# Patient Record
Sex: Female | Born: 1945
Health system: Southern US, Community
[De-identification: ages and names within clinical notes are randomized; demographics above are authoritative.]

## PROBLEM LIST (undated history)

## (undated) DIAGNOSIS — I10 Essential (primary) hypertension: Secondary | ICD-10-CM

## (undated) DIAGNOSIS — J309 Allergic rhinitis, unspecified: Secondary | ICD-10-CM

## (undated) DIAGNOSIS — I639 Cerebral infarction, unspecified: Secondary | ICD-10-CM

## (undated) DIAGNOSIS — M810 Age-related osteoporosis without current pathological fracture: Secondary | ICD-10-CM

## (undated) DIAGNOSIS — E871 Hypo-osmolality and hyponatremia: Secondary | ICD-10-CM

## (undated) DIAGNOSIS — G43109 Migraine with aura, not intractable, without status migrainosus: Secondary | ICD-10-CM

## (undated) DIAGNOSIS — E785 Hyperlipidemia, unspecified: Secondary | ICD-10-CM

## (undated) DIAGNOSIS — K219 Gastro-esophageal reflux disease without esophagitis: Secondary | ICD-10-CM

## (undated) DIAGNOSIS — R002 Palpitations: Secondary | ICD-10-CM

## (undated) HISTORY — DX: Palpitations: R00.2

## (undated) HISTORY — PX: ABDOMINAL HYSTERECTOMY: SHX81

## (undated) HISTORY — DX: Migraine with aura, not intractable, without status migrainosus: G43.109

## (undated) HISTORY — DX: Cerebral infarction, unspecified: I63.9

## (undated) HISTORY — DX: Hyperlipidemia, unspecified: E78.5

## (undated) HISTORY — DX: Allergic rhinitis, unspecified: J30.9

## (undated) HISTORY — DX: Hypo-osmolality and hyponatremia: E87.1

## (undated) HISTORY — DX: Age-related osteoporosis without current pathological fracture: M81.0

## (undated) HISTORY — PX: TONSILLECTOMY: SUR1361

## (undated) HISTORY — PX: CYSTOCELE REPAIR: SHX163

## (undated) HISTORY — DX: Gastro-esophageal reflux disease without esophagitis: K21.9

---

## 2014-06-28 DIAGNOSIS — I10 Essential (primary) hypertension: Secondary | ICD-10-CM | POA: Diagnosis not present

## 2014-06-28 DIAGNOSIS — E780 Pure hypercholesterolemia: Secondary | ICD-10-CM | POA: Diagnosis not present

## 2014-07-18 DIAGNOSIS — L814 Other melanin hyperpigmentation: Secondary | ICD-10-CM | POA: Diagnosis not present

## 2014-07-18 DIAGNOSIS — C44319 Basal cell carcinoma of skin of other parts of face: Secondary | ICD-10-CM | POA: Diagnosis not present

## 2014-07-18 DIAGNOSIS — D1801 Hemangioma of skin and subcutaneous tissue: Secondary | ICD-10-CM | POA: Diagnosis not present

## 2014-07-18 DIAGNOSIS — L821 Other seborrheic keratosis: Secondary | ICD-10-CM | POA: Diagnosis not present

## 2014-09-13 DIAGNOSIS — Z681 Body mass index (BMI) 19 or less, adult: Secondary | ICD-10-CM | POA: Diagnosis not present

## 2014-09-13 DIAGNOSIS — G459 Transient cerebral ischemic attack, unspecified: Secondary | ICD-10-CM | POA: Diagnosis not present

## 2014-09-13 DIAGNOSIS — M810 Age-related osteoporosis without current pathological fracture: Secondary | ICD-10-CM | POA: Diagnosis not present

## 2014-09-13 DIAGNOSIS — J309 Allergic rhinitis, unspecified: Secondary | ICD-10-CM | POA: Diagnosis not present

## 2014-09-13 DIAGNOSIS — I10 Essential (primary) hypertension: Secondary | ICD-10-CM | POA: Diagnosis not present

## 2014-09-13 DIAGNOSIS — R636 Underweight: Secondary | ICD-10-CM | POA: Diagnosis not present

## 2014-09-13 DIAGNOSIS — E780 Pure hypercholesterolemia: Secondary | ICD-10-CM | POA: Diagnosis not present

## 2014-09-13 DIAGNOSIS — H6690 Otitis media, unspecified, unspecified ear: Secondary | ICD-10-CM | POA: Diagnosis not present

## 2014-09-13 DIAGNOSIS — E46 Unspecified protein-calorie malnutrition: Secondary | ICD-10-CM | POA: Diagnosis not present

## 2014-09-21 DIAGNOSIS — R636 Underweight: Secondary | ICD-10-CM | POA: Diagnosis not present

## 2014-09-21 DIAGNOSIS — E780 Pure hypercholesterolemia: Secondary | ICD-10-CM | POA: Diagnosis not present

## 2014-09-21 DIAGNOSIS — I10 Essential (primary) hypertension: Secondary | ICD-10-CM | POA: Diagnosis not present

## 2014-09-21 DIAGNOSIS — H6123 Impacted cerumen, bilateral: Secondary | ICD-10-CM | POA: Diagnosis not present

## 2014-09-21 DIAGNOSIS — E46 Unspecified protein-calorie malnutrition: Secondary | ICD-10-CM | POA: Diagnosis not present

## 2014-09-21 DIAGNOSIS — J309 Allergic rhinitis, unspecified: Secondary | ICD-10-CM | POA: Diagnosis not present

## 2014-09-21 DIAGNOSIS — M810 Age-related osteoporosis without current pathological fracture: Secondary | ICD-10-CM | POA: Diagnosis not present

## 2014-09-21 DIAGNOSIS — G459 Transient cerebral ischemic attack, unspecified: Secondary | ICD-10-CM | POA: Diagnosis not present

## 2014-10-01 DIAGNOSIS — R636 Underweight: Secondary | ICD-10-CM | POA: Diagnosis not present

## 2014-10-01 DIAGNOSIS — M810 Age-related osteoporosis without current pathological fracture: Secondary | ICD-10-CM | POA: Diagnosis not present

## 2014-10-01 DIAGNOSIS — G459 Transient cerebral ischemic attack, unspecified: Secondary | ICD-10-CM | POA: Diagnosis not present

## 2014-10-01 DIAGNOSIS — I10 Essential (primary) hypertension: Secondary | ICD-10-CM | POA: Diagnosis not present

## 2014-10-01 DIAGNOSIS — N959 Unspecified menopausal and perimenopausal disorder: Secondary | ICD-10-CM | POA: Diagnosis not present

## 2014-10-01 DIAGNOSIS — E46 Unspecified protein-calorie malnutrition: Secondary | ICD-10-CM | POA: Diagnosis not present

## 2014-10-01 DIAGNOSIS — E780 Pure hypercholesterolemia: Secondary | ICD-10-CM | POA: Diagnosis not present

## 2014-10-01 DIAGNOSIS — J309 Allergic rhinitis, unspecified: Secondary | ICD-10-CM | POA: Diagnosis not present

## 2014-10-03 DIAGNOSIS — H6502 Acute serous otitis media, left ear: Secondary | ICD-10-CM | POA: Diagnosis not present

## 2014-10-03 DIAGNOSIS — H6982 Other specified disorders of Eustachian tube, left ear: Secondary | ICD-10-CM | POA: Diagnosis not present

## 2014-10-03 DIAGNOSIS — J342 Deviated nasal septum: Secondary | ICD-10-CM | POA: Diagnosis not present

## 2014-10-03 DIAGNOSIS — H9193 Unspecified hearing loss, bilateral: Secondary | ICD-10-CM | POA: Diagnosis not present

## 2014-10-17 DIAGNOSIS — H6982 Other specified disorders of Eustachian tube, left ear: Secondary | ICD-10-CM | POA: Diagnosis not present

## 2014-10-17 DIAGNOSIS — H6502 Acute serous otitis media, left ear: Secondary | ICD-10-CM | POA: Diagnosis not present

## 2014-10-18 DIAGNOSIS — Z1231 Encounter for screening mammogram for malignant neoplasm of breast: Secondary | ICD-10-CM | POA: Diagnosis not present

## 2014-10-20 DIAGNOSIS — N3020 Other chronic cystitis without hematuria: Secondary | ICD-10-CM | POA: Diagnosis not present

## 2014-10-20 DIAGNOSIS — R3915 Urgency of urination: Secondary | ICD-10-CM | POA: Diagnosis not present

## 2014-10-20 DIAGNOSIS — N815 Vaginal enterocele: Secondary | ICD-10-CM | POA: Diagnosis not present

## 2014-11-24 DIAGNOSIS — N3020 Other chronic cystitis without hematuria: Secondary | ICD-10-CM | POA: Diagnosis not present

## 2014-12-05 DIAGNOSIS — H6982 Other specified disorders of Eustachian tube, left ear: Secondary | ICD-10-CM | POA: Diagnosis not present

## 2014-12-05 DIAGNOSIS — H6505 Acute serous otitis media, recurrent, left ear: Secondary | ICD-10-CM | POA: Diagnosis not present

## 2014-12-08 DIAGNOSIS — D485 Neoplasm of uncertain behavior of skin: Secondary | ICD-10-CM | POA: Diagnosis not present

## 2014-12-08 DIAGNOSIS — N3020 Other chronic cystitis without hematuria: Secondary | ICD-10-CM | POA: Diagnosis not present

## 2014-12-08 DIAGNOSIS — N815 Vaginal enterocele: Secondary | ICD-10-CM | POA: Diagnosis not present

## 2014-12-19 DIAGNOSIS — H6505 Acute serous otitis media, recurrent, left ear: Secondary | ICD-10-CM | POA: Diagnosis not present

## 2014-12-19 DIAGNOSIS — H6982 Other specified disorders of Eustachian tube, left ear: Secondary | ICD-10-CM | POA: Diagnosis not present

## 2014-12-31 DIAGNOSIS — J309 Allergic rhinitis, unspecified: Secondary | ICD-10-CM | POA: Diagnosis not present

## 2014-12-31 DIAGNOSIS — R636 Underweight: Secondary | ICD-10-CM | POA: Diagnosis not present

## 2014-12-31 DIAGNOSIS — M810 Age-related osteoporosis without current pathological fracture: Secondary | ICD-10-CM | POA: Diagnosis not present

## 2014-12-31 DIAGNOSIS — N959 Unspecified menopausal and perimenopausal disorder: Secondary | ICD-10-CM | POA: Diagnosis not present

## 2014-12-31 DIAGNOSIS — E46 Unspecified protein-calorie malnutrition: Secondary | ICD-10-CM | POA: Diagnosis not present

## 2014-12-31 DIAGNOSIS — E7800 Pure hypercholesterolemia, unspecified: Secondary | ICD-10-CM | POA: Diagnosis not present

## 2014-12-31 DIAGNOSIS — G459 Transient cerebral ischemic attack, unspecified: Secondary | ICD-10-CM | POA: Diagnosis not present

## 2014-12-31 DIAGNOSIS — I10 Essential (primary) hypertension: Secondary | ICD-10-CM | POA: Diagnosis not present

## 2016-08-29 ENCOUNTER — Ambulatory Visit: Payer: Medicare Other | Admitting: Sports Medicine

## 2016-08-30 ENCOUNTER — Encounter: Payer: Self-pay | Admitting: Sports Medicine

## 2016-08-30 ENCOUNTER — Ambulatory Visit (INDEPENDENT_AMBULATORY_CARE_PROVIDER_SITE_OTHER): Payer: Medicare Other | Admitting: Sports Medicine

## 2016-08-30 VITALS — BP 114/64 | HR 68 | Ht 66.0 in | Wt 124.0 lb

## 2016-08-30 DIAGNOSIS — M79675 Pain in left toe(s): Secondary | ICD-10-CM | POA: Diagnosis not present

## 2016-08-30 DIAGNOSIS — L600 Ingrowing nail: Secondary | ICD-10-CM

## 2016-08-30 DIAGNOSIS — L601 Onycholysis: Secondary | ICD-10-CM | POA: Diagnosis not present

## 2016-08-30 NOTE — Progress Notes (Signed)
  Subjective:  Patient ID: Olivia Blackburn, female    DOB: 12/20/1945,  MRN: [id number] HPI Chief Complaint  Patient presents with  . Nail Problem    left great toe nail has had multiple injuries over the years is tender hand lifing from the nail bed  last week it was very sore    71 y.o. female presents with the above complaint. Complains of multiple traumas to her nail and states the nail is very loose. Reports the nail is painful and gets caught on her clothing and stockings. Denies drainage from the nail. Reports the toe is slightly reddened from recent spider bites.  No past medical history on file. No past surgical history on file.  Current Outpatient Prescriptions:  .  aspirin EC 81 MG tablet, Take 81 mg by mouth., Disp: , Rfl:  .  Calcium-Vitamin D-Vitamin K (CALCIUM SOFT CHEWS) 500-500-40 MG-UNT-MCG CHEW, Chew by mouth., Disp: , Rfl:  .  denosumab (PROLIA) 60 MG/ML SOLN injection, Inject 60 mg into the skin every 6 (six) months. Administer in upper arm, thigh, or abdomen, Disp: , Rfl:  .  lisinopril-hydrochlorothiazide (PRINZIDE,ZESTORETIC) 10-12.5 MG tablet, Take by mouth., Disp: , Rfl:  .  loratadine (CLARITIN) 10 MG tablet, Take 10 mg by mouth., Disp: , Rfl:  .  trimethoprim (TRIMPEX) 100 MG tablet, Take 100 mg by mouth 2 (two) times daily., Disp: , Rfl:   Allergies  Allergen Reactions  . Azithromycin Rash   Review of Systems  See Nursing note. Objective:   Vitals:   08/30/16 0928  BP: 114/64  Pulse: 68   General AA&O x3. Normal mood and affect.  Vascular Dorsalis pedis and posterior tibial pulses 2/4 bilat. Brisk capillary refill to all digits. Pedal hair present. Small verocosities bilat.  Neurologic Epicritic sensation grossly intact.  Dermatologic L hallux nail >50% lysis distally. Adhered proximally. Small area of dried hemorrhage underneath the nail.  Orthopedic: MMT 5/5 in dorsiflexion, plantarflexion, inversion, and eversion. Normal joint ROM without pain  or crepitus.   Radiographs: Taken and reviewed. No acute fractures or dislocations. No other osseous abnormalities.  Assessment & Plan:  Patient was evaluated and treated and all questions answered.  Onycholysis of Nail, Pain in Toe L Great Toe -Discussed r/b/a of nail removal. Patient elected to proceed. -Nail anesthestized with 3cc 50/50 1% Lidocaine plain / 0.5% Marcaine plain. -Nail freed with a freer and avulsed with a hemostat. Redundant subungual tissue excised with a tissue nipper. Dressed with silvadene and dry dressing. -Educated on soaking instructions. Written instructions dispensed.  Return in about 2 weeks (around 09/16/2016).   Patient seen with Dr. Shawnie DapperPrice  Vi Whitesel

## 2016-08-30 NOTE — Progress Notes (Signed)
   Subjective:    Patient ID: Olivia Blackburn, female    DOB: 08/01/1945, 71 y.o.   MRN: [id number]  HPI  I have injured my left great toe on numerous occasions last week it got very sore and is now lifting away from the nailbed.    Review of Systems  All other systems reviewed and are negative.      Objective:   Physical Exam        Assessment & Plan:

## 2016-08-30 NOTE — Patient Instructions (Signed)

## 2016-09-16 ENCOUNTER — Ambulatory Visit (INDEPENDENT_AMBULATORY_CARE_PROVIDER_SITE_OTHER): Payer: Self-pay | Admitting: Podiatry

## 2016-09-16 DIAGNOSIS — L600 Ingrowing nail: Secondary | ICD-10-CM

## 2016-09-16 DIAGNOSIS — Z9889 Other specified postprocedural states: Secondary | ICD-10-CM

## 2016-09-16 NOTE — Progress Notes (Signed)
   Subjective:    Patient ID: Olivia Blackburn, female    DOB: Apr 14, 1945, 71 y.o.   MRN: [id number]  HPI 71 y.o. female presents for follow up of L great toenail avulsion performed two weeks ago. Reports the toe was sore for the first week. Reports soaking once daily. States the procedure site feels "good but a little sore."  Review of Systems - not performed.     Objective:   Physical Exam  There were no vitals filed for this visit. General AA&O x3. Normal mood and affect.  Vascular Foot warm and well perfused with good capillary refill.  Neurologic Sensation grossly intact.  Dermatologic Nail avulsion site healing well without drainage or erythema. Nail bed with overlying soft crust. Left intact. No signs of local infection.  Orthopedic: No tenderness to palpation of the toe.     Assessment & Plan:   2 weeks s/p L Total Nail Avulsion with Matrixectomy -Healing well. No signs of infection. -Advised to leave OTA. No need for continued soaking.  Return if symptoms worsen or fail to improve.

## 2017-05-13 ENCOUNTER — Emergency Department (HOSPITAL_COMMUNITY): Payer: Medicare Other

## 2017-05-13 ENCOUNTER — Emergency Department (HOSPITAL_COMMUNITY)
Admission: EM | Admit: 2017-05-13 | Discharge: 2017-05-13 | Disposition: A | Discharge status: Home / Self Care | Payer: Medicare Other | Attending: Emergency Medicine | Admitting: Emergency Medicine

## 2017-05-13 DIAGNOSIS — Y998 Other external cause status: Secondary | ICD-10-CM | POA: Diagnosis not present

## 2017-05-13 DIAGNOSIS — Z7982 Long term (current) use of aspirin: Secondary | ICD-10-CM | POA: Insufficient documentation

## 2017-05-13 DIAGNOSIS — S92354A Nondisplaced fracture of fifth metatarsal bone, right foot, initial encounter for closed fracture: Secondary | ICD-10-CM | POA: Insufficient documentation

## 2017-05-13 DIAGNOSIS — W010XXA Fall on same level from slipping, tripping and stumbling without subsequent striking against object, initial encounter: Secondary | ICD-10-CM | POA: Insufficient documentation

## 2017-05-13 DIAGNOSIS — S82831A Other fracture of upper and lower end of right fibula, initial encounter for closed fracture: Secondary | ICD-10-CM | POA: Diagnosis not present

## 2017-05-13 DIAGNOSIS — Z79899 Other long term (current) drug therapy: Secondary | ICD-10-CM | POA: Insufficient documentation

## 2017-05-13 DIAGNOSIS — Y92009 Unspecified place in unspecified non-institutional (private) residence as the place of occurrence of the external cause: Secondary | ICD-10-CM | POA: Insufficient documentation

## 2017-05-13 DIAGNOSIS — S32010A Wedge compression fracture of first lumbar vertebra, initial encounter for closed fracture: Secondary | ICD-10-CM | POA: Insufficient documentation

## 2017-05-13 DIAGNOSIS — Y9389 Activity, other specified: Secondary | ICD-10-CM | POA: Insufficient documentation

## 2017-05-13 DIAGNOSIS — S3992XA Unspecified injury of lower back, initial encounter: Secondary | ICD-10-CM | POA: Diagnosis present

## 2017-05-13 DIAGNOSIS — W19XXXA Unspecified fall, initial encounter: Secondary | ICD-10-CM

## 2017-05-13 LAB — CBC WITH DIFFERENTIAL/PLATELET
BASOS ABS: 0 10*3/uL (ref 0.0–0.1)
BASOS PCT: 0 %
EOS PCT: 2 %
Eosinophils Absolute: 0.2 10*3/uL (ref 0.0–0.7)
HEMATOCRIT: 38.6 % (ref 36.0–46.0)
Hemoglobin: 13 g/dL (ref 12.0–15.0)
LYMPHS PCT: 12 %
Lymphs Abs: 0.9 10*3/uL (ref 0.7–4.0)
MCH: 31.2 pg (ref 26.0–34.0)
MCHC: 33.7 g/dL (ref 30.0–36.0)
MCV: 92.6 fL (ref 78.0–100.0)
Monocytes Absolute: 0.4 10*3/uL (ref 0.1–1.0)
Monocytes Relative: 6 %
NEUTROS ABS: 6.5 10*3/uL (ref 1.7–7.7)
Neutrophils Relative %: 80 %
PLATELETS: 270 10*3/uL (ref 150–400)
RBC: 4.17 MIL/uL (ref 3.87–5.11)
RDW: 13 % (ref 11.5–15.5)
WBC: 8 10*3/uL (ref 4.0–10.5)

## 2017-05-13 LAB — BASIC METABOLIC PANEL
ANION GAP: 10 (ref 5–15)
BUN: 28 mg/dL — ABNORMAL HIGH (ref 6–20)
CO2: 28 mmol/L (ref 22–32)
Calcium: 9.6 mg/dL (ref 8.9–10.3)
Chloride: 101 mmol/L (ref 101–111)
Creatinine, Ser: 1.04 mg/dL — ABNORMAL HIGH (ref 0.44–1.00)
GFR calc Af Amer: >60 mL/min (ref >60)
GFR calc non Af Amer: 52 mL/min — ABNORMAL LOW (ref >60)
Glucose, Bld: 104 mg/dL — ABNORMAL HIGH (ref 65–99)
POTASSIUM: 4.1 mmol/L (ref 3.5–5.1)
SODIUM: 139 mmol/L (ref 135–145)

## 2017-05-13 MED ORDER — HYDROCODONE-ACETAMINOPHEN 5-325 MG PO TABS: 1.0000 | ORAL_TABLET | Freq: Four times a day (QID) | ORAL | 0 refills | Status: DC | PRN

## 2017-05-13 MED ORDER — HYDROCODONE-ACETAMINOPHEN 5-325 MG PO TABS
1.0000 | ORAL_TABLET | Freq: Once | ORAL | Status: AC
  Administered 2017-05-13: 1 via ORAL
  Filled 2017-05-13: qty 1

## 2017-05-13 MED ORDER — DOCUSATE SODIUM 100 MG PO CAPS: 100.0000 mg | ORAL_CAPSULE | Freq: Two times a day (BID) | ORAL | 0 refills | Status: DC

## 2017-05-13 NOTE — Discharge Instructions (Signed)
Get help right away if: Your pain is very bad and it suddenly gets worse. You are unable to move any body part (paralysis) that is below the level of your injury. You have numbness, tingling, or weakness in any body part that is below the level of your injury. You cannot control your bladder or bowels.

## 2017-05-13 NOTE — ED Notes (Signed)
Patient transported to CT 

## 2017-05-13 NOTE — ED Notes (Signed)
Pt ambulate from the room to the restroom with 2 person assist and back from the restroom to the room with no assist

## 2017-05-13 NOTE — ED Provider Notes (Signed)
[facility]-EMERGENCY DEPT Provider Note   CSN: [id number] Arrival date & time: 05/13/17  1452     History   Chief Complaint Chief Complaint  Patient presents with  . Fall    HPI Olivia Blackburn is a 72 y.o. female  Who fell in her home on the way to answer her door. She tripped over her R foot and fell onto her R side. She c/o pain over the R ankle and lumbar region. She did not hit her head or LOC. She denies numbness, tnigling or weakness. She does have swelling in the foot. She was unable to ambulate and had to call 911. She is not on anticoagulation.  HPI  No past medical history on file.  There are no active problems to display for this patient.   The histories are not reviewed yet. Please review them in the "History" navigator section and refresh this SmartLink.   OB History   None      Home Medications    Prior to Admission medications   Medication Sig Start Date End Date Taking? Authorizing Provider  aspirin EC 81 MG tablet Take 81 mg by mouth daily.  08/06/11  Yes [provider]  Calcium-Vitamin D-Vitamin K (CALCIUM SOFT CHEWS) 500-500-40 MG-UNT-MCG CHEW Chew 1 tablet by mouth daily.  08/06/11  Yes [provider]  cetirizine (ZYRTEC ALLERGY) 10 MG tablet Take 10 mg by mouth daily as needed for allergies.   Yes [provider]  conjugated estrogens (PREMARIN) vaginal cream Place 1 Applicatorful vaginally every other day.   Yes [provider]  lisinopril (PRINIVIL,ZESTRIL) 20 MG tablet  02/19/17  Yes [provider]  loratadine (CLARITIN) 10 MG tablet Take 10 mg by mouth daily as needed for allergies.  08/06/11  Yes [provider]  traMADol (ULTRAM) 50 MG tablet TAKE 1 TABLET EVERY 6 HOURS AS NEEDED FOR MODERATE PAIN. 04/29/17  Yes [provider]  trimethoprim (TRIMPEX) 100 MG tablet Take 100 mg by mouth at bedtime.    Yes [provider]  denosumab (PROLIA) 60 MG/ML SOLN  injection Inject 60 mg into the skin every 6 (six) months. Administer in upper arm, thigh, or abdomen    [provider]  docusate sodium (COLACE) 100 MG capsule Take 1 capsule (100 mg total) by mouth every 12 (twelve) hours. 05/13/17   Arthor CaptainHarris, Nonna Renninger, PA-C  HYDROcodone-acetaminophen (NORCO) 5-325 MG tablet Take 1-2 tablets by mouth every 6 (six) hours as needed for moderate pain. 05/13/17   Arthor CaptainHarris, Eschol Auxier, PA-C    Family History No family history on file.  Social History Social History   Tobacco Use  . Smoking status: Never Smoker  . Smokeless tobacco: Never Used  Substance Use Topics  . Alcohol use: Not on file  . Drug use: Not on file     Allergies   Azithromycin   Review of Systems Review of Systems  Ten systems reviewed and are negative for acute change, except as noted in the HPI.   Physical Exam Updated Vital Signs BP 112/62   Pulse 74   Temp 97.8 F (36.6 C) (Oral)   Resp 16   Ht 5\' 6"  (1.676 m)   Wt 57.2 kg (126 lb)   SpO2 98%   BMI 20.34 kg/m   Physical Exam  Constitutional: She is oriented to person, place, and time. She appears well-developed and well-nourished. No distress.  HENT:  Head: Normocephalic and atraumatic.  Eyes: Conjunctivae are normal. No scleral  icterus.  Neck: Normal range of motion.  Cardiovascular: Normal rate, regular rhythm and normal heart sounds. Exam reveals no gallop and no friction rub.  No murmur heard. Pulmonary/Chest: Effort normal and breath sounds normal. No respiratory distress.  Abdominal: Soft. Bowel sounds are normal. She exhibits no distension and no mass. There is no tenderness. There is no guarding.  Musculoskeletal: She exhibits tenderness.  Midline lumbar tenderness  Right foot with market edema over the proximal lateral metatarsal region and market edema over the right lateral malleolus,  Full passive range of motion of the ankle, tender to palpation with movement, normal sensation and pulses    Neurological: She is alert and oriented to person, place, and time.  Skin: Skin is warm and dry. She is not diaphoretic.  Psychiatric: Her behavior is normal.  Nursing note and vitals reviewed.    ED Treatments / Results  Labs (all labs ordered are listed, but only abnormal results are displayed) Labs Reviewed  BASIC METABOLIC PANEL - Abnormal; Notable for the following components:      Result Value   Glucose, Bld 104 (*)    BUN 28 (*)    Creatinine, Ser 1.04 (*)    GFR calc non Af Amer 52 (*)    All other components within normal limits  CBC WITH DIFFERENTIAL/PLATELET    EKG None  Radiology Dg Lumbar Spine Complete  Result Date: 05/13/2017 CLINICAL DATA:  Fall EXAM: LUMBAR SPINE - COMPLETE 4+ VIEW COMPARISON:  None. FINDINGS: Five non rib bearing lumbar type vertebra. Lumbar alignment within normal limits. Moderate degenerative changes at L5-S1. Mild degenerative spurring of the lumbar spine. Probable acute moderate superior endplate deformity at L1. Less than 25% loss of height of the anterior vertebral body. IMPRESSION: 1. Probable acute moderate superior endplate compression fracture at L1 2. Degenerative changes of the lumbar spine. Electronically Signed   By: Jasmine Pang M.D.   On: 05/13/2017 16:52   Dg Sacrum/coccyx  Result Date: 05/13/2017 CLINICAL DATA:  Fall EXAM: SACRUM AND COCCYX - 2+ VIEW COMPARISON:  None. FINDINGS: Old appearing fracture deformity of the right inferior pubic ramus. Pubic symphysis is intact. No acute displaced sacral fracture. Calcified phleboliths in the left pelvis. IMPRESSION: 1. No definite acute osseous abnormality 2. Old appearing fracture right inferior pubic ramus Electronically Signed   By: Jasmine Pang M.D.   On: 05/13/2017 16:50   Dg Ankle Complete Right  Result Date: 05/13/2017 CLINICAL DATA:  Fall with ankle pain EXAM: RIGHT ANKLE - COMPLETE 3+ VIEW COMPARISON:  None. FINDINGS: Lateral soft tissue swelling. Acute avulsion fracture  injury tip of the lateral malleolus of the fibula. Ankle mortise symmetric. Nondisplaced fracture base of fifth metatarsal. Small plantar calcaneal spur IMPRESSION: 1. Acute avulsion fracture injury at the tip of the lateral fibular malleolus with soft tissue swelling 2. Nondisplaced fracture base of the fifth metatarsal Electronically Signed   By: Jasmine Pang M.D.   On: 05/13/2017 16:47   Ct Lumbar Spine Wo Contrast  Result Date: 05/13/2017 CLINICAL DATA:  72 y/o  F; fall with back pain. EXAM: CT LUMBAR SPINE WITHOUT CONTRAST TECHNIQUE: Multidetector CT imaging of the lumbar spine was performed without intravenous contrast administration. Multiplanar CT image reconstructions were also generated. COMPARISON:  05/13/2017 lumbar spine radiographs. FINDINGS: Segmentation: 5 lumbar type vertebrae. Alignment: Mild S-shaped curvature of lumbar spine. Normal lumbar lordosis without listhesis. Vertebrae: L1 superior endplate acute anterior compression deformity with 20% loss of vertebral body height. No other fracture identified. Paraspinal  and other soft tissues: Right lobe of liver cyst measuring 25 mm. Disc levels: Mild loss of intervertebral disc space height at the L3-4 and L4-5 levels with moderate loss of intervertebral disc space height at L5-S1. Disc bulges at the L3 through S1 levels result in multilevel mild foraminal stenosis. At the right L5-S1 level, facet hypertrophy and endplate marginal osteophytes contribute to moderate right-sided foraminal stenosis. IMPRESSION: 1. Acute L1 superior endplate anterior compression deformity with 20% loss of vertebral body height. No other fracture identified. 2. Lumbar spondylosis greatest at L5-S1. No high-grade foraminal or canal stenosis identified. Electronically Signed   By: Mitzi Hansen M.D.   On: 05/13/2017 17:57   Dg Foot Complete Right  Result Date: 05/13/2017 CLINICAL DATA:  Fall EXAM: RIGHT FOOT COMPLETE - 3+ VIEW COMPARISON:  None.  FINDINGS: Acute minimally displaced fracture base of fifth metatarsal with extension to the fifth TMT joint. No subluxation. Mild degenerative changes at the first MTP joint. Small plantar calcaneal spur IMPRESSION: Acute minimally displaced intra-articular fracture base of fifth metatarsal Electronically Signed   By: Jasmine Pang M.D.   On: 05/13/2017 16:49    Procedures Procedures (including critical care time)  Medications Ordered in ED Medications  HYDROcodone-acetaminophen (NORCO/VICODIN) 5-325 MG per tablet 1 tablet (1 tablet Oral Given 05/13/17 1702)     Initial Impression / Assessment and Plan / ED Course  I have reviewed the triage vital signs and the nursing notes.  Pertinent labs & imaging results that were available during my care of the patient were reviewed by me and considered in my medical decision making (see chart for details).     Patient with L1 vertebral compression fracture visualized on plain film and CT scan.  Only 20% height loss on CT.  Patient placed in TLSO splint.  She has a pseudo-Jones fracture of the right foot and a small avulsion fracture of the distal tibia.  These are stable fractures and are safe for weightbearing.  I have reviewed these images with Dr. Shaune Pollack who agrees with plan of care.  Patient placed in cam walker boot labs are otherwise without significant abnormality.  Patient is able to ambulate safely here in the emergency department.  Patient be discharged with Norco for pain control, appropriate outpatient follow-up and return precautions.  Final Clinical Impressions(s) / ED Diagnoses   Final diagnoses:  Fall, initial encounter  Closed wedge compression fracture of first lumbar vertebra, initial encounter (HCC)  Other closed fracture of distal end of right fibula, initial encounter  Closed nondisplaced fracture of fifth metatarsal bone of right foot, initial encounter    ED Discharge Orders        Ordered     HYDROcodone-acetaminophen (NORCO) 5-325 MG tablet  Every 6 hours PRN     05/13/17 2220    docusate sodium (COLACE) 100 MG capsule  Every 12 hours     05/13/17 2220       Arthor Captain, PA-C 05/13/17 2230    Nira Conn, MD 05/14/17 [phone]

## 2017-05-13 NOTE — ED Notes (Signed)
Patient transported to X-ray 

## 2017-05-13 NOTE — ED Triage Notes (Signed)
Transported by [facility] from home--experienced a fall today injuring right foot and lower back. Swelling and obvious deformity to right foot per EMS. Denies hitting her head or any LOC. EMS administered 600 mg of Ibuprofen. VSS with EMS.

## 2017-05-13 NOTE — ED Notes (Signed)
Bed: [id number] Expected date:  Expected time:  Means of arrival:  Comments: EMS-fall-foot deformity

## 2017-05-16 ENCOUNTER — Other Ambulatory Visit: Payer: Self-pay

## 2017-05-16 ENCOUNTER — Emergency Department (HOSPITAL_COMMUNITY)
Admission: EM | Admit: 2017-05-16 | Discharge: 2017-05-16 | Disposition: A | Discharge status: Home / Self Care | Payer: Medicare Other | Attending: Emergency Medicine | Admitting: Emergency Medicine

## 2017-05-16 ENCOUNTER — Encounter (HOSPITAL_COMMUNITY): Payer: Self-pay

## 2017-05-16 DIAGNOSIS — I10 Essential (primary) hypertension: Secondary | ICD-10-CM | POA: Insufficient documentation

## 2017-05-16 DIAGNOSIS — G8911 Acute pain due to trauma: Secondary | ICD-10-CM | POA: Diagnosis not present

## 2017-05-16 DIAGNOSIS — Z79899 Other long term (current) drug therapy: Secondary | ICD-10-CM | POA: Diagnosis not present

## 2017-05-16 DIAGNOSIS — S93409A Sprain of unspecified ligament of unspecified ankle, initial encounter: Secondary | ICD-10-CM | POA: Insufficient documentation

## 2017-05-16 DIAGNOSIS — S32010D Wedge compression fracture of first lumbar vertebra, subsequent encounter for fracture with routine healing: Secondary | ICD-10-CM | POA: Insufficient documentation

## 2017-05-16 DIAGNOSIS — Z7982 Long term (current) use of aspirin: Secondary | ICD-10-CM | POA: Diagnosis not present

## 2017-05-16 DIAGNOSIS — S92353A Displaced fracture of fifth metatarsal bone, unspecified foot, initial encounter for closed fracture: Secondary | ICD-10-CM

## 2017-05-16 DIAGNOSIS — W1830XD Fall on same level, unspecified, subsequent encounter: Secondary | ICD-10-CM | POA: Insufficient documentation

## 2017-05-16 HISTORY — DX: Displaced fracture of fifth metatarsal bone, unspecified foot, initial encounter for closed fracture: S92.353A

## 2017-05-16 HISTORY — DX: Essential (primary) hypertension: I10

## 2017-05-16 HISTORY — DX: Sprain of unspecified ligament of unspecified ankle, initial encounter: S93.409A

## 2017-05-16 MED ORDER — OXYCODONE HCL 5 MG PO TABS
10.0000 mg | ORAL_TABLET | ORAL | Status: AC
  Administered 2017-05-16: 10 mg via ORAL
  Filled 2017-05-16: qty 2

## 2017-05-16 MED ORDER — METHOCARBAMOL 500 MG PO TABS
500.0000 mg | ORAL_TABLET | Freq: Once | ORAL | Status: AC
  Administered 2017-05-16: 500 mg via ORAL
  Filled 2017-05-16: qty 1

## 2017-05-16 MED ORDER — OXYCODONE HCL 5 MG PO TABS: 5.0000 mg | ORAL_TABLET | ORAL | 0 refills | Status: DC | PRN

## 2017-05-16 MED ORDER — METHOCARBAMOL 500 MG PO TABS: 500.0000 mg | ORAL_TABLET | Freq: Four times a day (QID) | ORAL | 0 refills | Status: DC | PRN

## 2017-05-16 NOTE — ED Triage Notes (Signed)
Per [facility]: Pt coming from home complaining of lower back pain. Pt fell two days ago resulting in a fracture to right foot and L1 compression. Currently wearing brace. Hx of htn but reports she did not take today. Reports she took Oxycodone at 2200 without relief.

## 2017-05-16 NOTE — ED Provider Notes (Signed)
[facility]   CSN: [id number] Arrival date & time: 05/16/17  0200     History   Chief Complaint Chief Complaint  Patient presents with  . Back Pain    HPI MODELL FENDRICK is a 72 y.o. female.  The history is provided by the patient.  She has history of hypertension and had been seen in the emergency department 3 days ago with a fall with fractures of her right foot and compression fracture of L1.  She has a TLSO brace and had been given prescription for hydrocodone-acetaminophen.  She had been doing well at home, and took a dose of hydrocodone's-acetaminophen at bedtime, but has been in severe pain and has been unable to move because of the pain.  She denies any weakness or numbness or tingling.  She is concerned because she lives alone.  She also notices muscle spasm-like something grabbing her.  She has an appointment with a neurosurgeon on May 2, but does not feel she can wait that long.  Past Medical History:  Diagnosis Date  . Hypertension     There are no active problems to display for this patient.   Past Surgical History:  Procedure Laterality Date  . ABDOMINAL HYSTERECTOMY       OB History   None      Home Medications    Prior to Admission medications   Medication Sig Start Date End Date Taking? Authorizing Provider  aspirin EC 81 MG tablet Take 81 mg by mouth daily.  08/06/11   [provider]  Calcium-Vitamin D-Vitamin K (CALCIUM SOFT CHEWS) 500-500-40 MG-UNT-MCG CHEW Chew 1 tablet by mouth daily.  08/06/11   [provider]  cetirizine (ZYRTEC ALLERGY) 10 MG tablet Take 10 mg by mouth daily as needed for allergies.    [provider]  conjugated estrogens (PREMARIN) vaginal cream Place 1 Applicatorful vaginally every other day.    [provider]  denosumab (PROLIA) 60 MG/ML SOLN injection Inject 60 mg into the skin every 6 (six) months. Administer in upper arm, thigh, or  abdomen    [provider]  docusate sodium (COLACE) 100 MG capsule Take 1 capsule (100 mg total) by mouth every 12 (twelve) hours. 05/13/17   Arthor Captain, PA-C  HYDROcodone-acetaminophen (NORCO) 5-325 MG tablet Take 1-2 tablets by mouth every 6 (six) hours as needed for moderate pain. 05/13/17   Arthor Captain, PA-C  lisinopril (PRINIVIL,ZESTRIL) 20 MG tablet  02/19/17   [provider]  loratadine (CLARITIN) 10 MG tablet Take 10 mg by mouth daily as needed for allergies.  08/06/11   [provider]  traMADol (ULTRAM) 50 MG tablet TAKE 1 TABLET EVERY 6 HOURS AS NEEDED FOR MODERATE PAIN. 04/29/17   [provider]  trimethoprim (TRIMPEX) 100 MG tablet Take 100 mg by mouth at bedtime.     [provider]    Family History No family history on file.  Social History Social History   Tobacco Use  . Smoking status: Never Smoker  . Smokeless tobacco: Never Used  Substance Use Topics  . Alcohol use: Not on file  . Drug use: Not on file     Allergies   Azithromycin   Review of Systems Review of Systems  All other systems reviewed and are negative.    Physical Exam Updated Vital Signs BP 133/77 (BP Location: Left Arm)   Pulse 95   Temp 98.5 F (36.9 C) (Oral)   Resp 18  Ht 5\' 6"  (1.676 m)   Wt 57.2 kg (126 lb)   SpO2 95%   BMI 20.34 kg/m   Physical Exam  Nursing note and vitals reviewed.  72 year old female, resting comfortably and in no acute distress. Vital signs are normal. Oxygen saturation is 95%, which is normal. Head is normocephalic and atraumatic. PERRLA, EOMI. Oropharynx is clear. Neck is nontender and supple without adenopathy or JVD. Back: TLSO brace is in place and not removed. Lungs are clear without rales, wheezes, or rhonchi. Chest is nontender. Heart has regular rate and rhythm without murmur. Abdomen is soft, flat, nontender without masses or hepatosplenomegaly and peristalsis is normoactive. Extremities  have no cyanosis or edema, full range of motion is present. Skin is warm and dry without rash. Neurologic: Mental status is normal, cranial nerves are intact, there are no motor or sensory deficits.  ED Treatments / Results   Procedures Procedures   Medications Ordered in ED Medications  oxyCODONE (Oxy IR/ROXICODONE) immediate release tablet 10 mg (has no administration in time range)  methocarbamol (ROBAXIN) tablet 500 mg (has no administration in time range)     Initial Impression / Assessment and Plan / ED Course  I have reviewed the triage vital signs and the nursing notes.  Pertinent labs & imaging results that were available during my care of the patient were reviewed by me and considered in my medical decision making (see chart for details).  Compression fracture of T1 with poor pain control.  Old records are reviewed, and CT scan done at that visit on April 23 showed compression fracture of L1 with 20% loss of height.  Since she has failed hydrocodone, will try oxycodone.  She also has significant muscle spasm, so we will give a dose of methocarbamol.  No indication for repeat imaging.   5:28 AM She had good relief of pain with above-noted treatment.  She is discharged with prescriptions for methocarbamol and oxycodone.  Advised to supplement with acetaminophen and/or ibuprofen to get additional pain relief.  Advised to apply ice.  Since symptoms seem to be worse with wearing a brace, advised to try going without the brace to see if she is feeling any better.  She is to call her neurosurgeon's office to see if she can get an appointment sooner than the scheduled one.  Return precautions discussed.  Final Clinical Impressions(s) / ED Diagnoses   Final diagnoses:  Closed compression fracture of L1 lumbar vertebra with routine healing, subsequent encounter    ED Discharge Orders        Ordered    methocarbamol (ROBAXIN) 500 MG tablet  Every 6 hours PRN     05/16/17 0525     oxyCODONE (ROXICODONE) 5 MG immediate release tablet  Every 4 hours PRN     05/16/17 0525       Dione BoozeGlick, Thekla Colborn, MD 05/16/17 [phone]

## 2017-05-16 NOTE — ED Notes (Signed)
ED Provider at bedside. 

## 2017-05-16 NOTE — Discharge Instructions (Addendum)
Apply ice as needed. Wear your brace as needed.  In addition to the prescribed medication, you can take acetaminophen (Tylenol) and/or ibuprofen to get additional pain relief.  Talk with your neurosurgeon to see if kyphoplasty (injection of cement into the collapsed vertebra) would be helpful.

## 2017-07-02 ENCOUNTER — Telehealth: Payer: Self-pay

## 2017-07-02 NOTE — Telephone Encounter (Signed)
Sent referral to scheduling, no notes

## 2017-07-03 NOTE — Progress Notes (Signed)
Cardiology Office Note:    Date:  07/07/2017   ID:  Olivia JewsMary B Hritz, DOB 03/26/1945, MRN [id number]  PCP:  Simone CuriaLee, Keung, MD  Cardiologist:  Norman HerrlichBrian Wisam Siefring, MD   Referring MD: Simone CuriaLee, Keung, MD  ASSESSMENT:    1. APC (atrial premature contractions)   2. Essential hypertension    PLAN:    In order of problems listed above:  1. She is asymptomatic at this time is no evidence of underlying heart disease.  For further evaluation we will check a TSH with a strong association of thyroid dysfunction and atrial arrhythmia and echocardiogram to screen her for underlying heart disease and a Holter monitor to assess for atrial fibrillation.  At this time I do not see an indication to suppress her arrhythmia or to consider anticoagulant therapy.  Following echocardiogram Holter monitor I will see back in the office in 6 weeks  2. stable continue current treatment ACE inhibitor 3. Osteoporosis is stable treated by her PCP  Next appointment 6 weeks   Medication Adjustments/Labs and Tests Ordered: Current medicines are reviewed at length with the patient today.  Concerns regarding medicines are outlined above.  Orders Placed This Encounter  Procedures  . TSH  . EKG 12-Lead  . ECHOCARDIOGRAM COMPLETE   No orders of the defined types were placed in this encounter.    Chief Complaint  Patient presents with  . Irregular Heart Beat    I have APc's    History of Present Illness:    Olivia Blackburn is a 72 y.o. female with hypertension , hyperlipidemia and history of previous stroke who is being seen today for the evaluation of PVC's at the request of Simone CuriaLee, Keung, MD. [facility] and was told she had frequent atrial premature beats.  That is led to referral to my office.  She has had no palpitation up until a fall in the last 6 to 8 weeks with compression fracture and a fracture of her foot she is very vigorous active exercise daily but she did have one episode  several months ago where she felt badly while exercising had to cut back off in her activities but no recurrence no syncope and no awareness of her heartbeat.  She had a murmur when she was teenager took antibiotics prophylactically in adulthood but no documented heart disease and has had no previous evaluation.  No chest pain shortness of breath orthopnea edema or syncope.  She is unaware if she has had thyroid studies done recently her hypertension is well controlled ACE inhibitor Past Medical History:  Diagnosis Date  . Allergic rhinitis 07/04/2017  . Cerebrovascular accident (CVA) (HCC) 07/04/2017  . GERD (gastroesophageal reflux disease) 07/04/2017  . Hyperlipidemia 07/04/2017  . Hypertension   . Hyponatremia 07/04/2017  . Migraine with prolonged aura, not intractable 07/04/2017  . Osteoporosis 07/04/2017  . Palpitations 07/04/2017    Past Surgical History:  Procedure Laterality Date  . ABDOMINAL HYSTERECTOMY    . CYSTOCELE REPAIR    . TONSILLECTOMY      Current Medications: Current Meds  Medication Sig  . aspirin EC 81 MG tablet Take 81 mg by mouth daily.   . Calcium-Vitamin D-Vitamin K (CALCIUM SOFT CHEWS) 500-500-40 MG-UNT-MCG CHEW Chew 1 tablet by mouth daily.   Marland Kitchen. conjugated estrogens (PREMARIN) vaginal cream Place 1 Applicatorful vaginally every other day.  . denosumab (PROLIA) 60 MG/ML SOLN injection Inject 60 mg into the skin every 6 (six) months. Administer in  upper arm, thigh, or abdomen  . lisinopril (PRINIVIL,ZESTRIL) 20 MG tablet Take 20 mg by mouth daily.   . methocarbamol (ROBAXIN) 500 MG tablet Take 1 tablet (500 mg total) by mouth every 6 (six) hours as needed for muscle spasms.  Marland Kitchen oxyCODONE-acetaminophen (PERCOCET/ROXICET) 5-325 MG tablet TAKE 1 TABLET BY MOUTH EVERY 6 TO 8 HOURS AS NEEDED FOR PAIN.  . [DISCONTINUED] cetirizine (ZYRTEC ALLERGY) 10 MG tablet Take 10 mg by mouth daily as needed for allergies.  . [DISCONTINUED] oxyCODONE (ROXICODONE) 5 MG immediate release  tablet Take 1-2 tablets (5-10 mg total) by mouth every 4 (four) hours as needed for severe pain.     Allergies:   Ibuprofen and Azithromycin   Social History   Socioeconomic History  . Marital status: Married    Spouse name: Not on file  . Number of children: Not on file  . Years of education: Not on file  . Highest education level: Not on file  Occupational History  . Not on file  Social Needs  . Financial resource strain: Not on file  . Food insecurity:    Worry: Not on file    Inability: Not on file  . Transportation needs:    Medical: Not on file    Non-medical: Not on file  Tobacco Use  . Smoking status: Never Smoker  . Smokeless tobacco: Never Used  Substance and Sexual Activity  . Alcohol use: Not Currently  . Drug use: Not Currently  . Sexual activity: Not on file  Lifestyle  . Physical activity:    Days per week: Not on file    Minutes per session: Not on file  . Stress: Not on file  Relationships  . Social connections:    Talks on phone: Not on file    Gets together: Not on file    Attends religious service: Not on file    Active member of club or organization: Not on file    Attends meetings of clubs or organizations: Not on file    Relationship status: Not on file  Other Topics Concern  . Not on file  Social History Narrative  . Not on file     Family History: The patient's family history includes Bone cancer in her maternal grandfather; Cerebrovascular Disease in her father; Diabetes in her brother and sister; Heart attack in her brother; Heart disease in her brother and father; Hyperlipidemia in her father, paternal grandfather, and paternal grandmother; Hypertension in her father; Ovarian cancer in her mother; Pancreatic cancer in her sister; Stroke in her brother and father.  ROS:   ROS Please see the history of present illness.   She has had back pain compression fracture and foot pain from a traumatic fracture of her foot.  All other systems  reviewed and are negative.  EKGs/Labs/Other Studies Reviewed:    The following studies were reviewed today: Records at [facility] reviewed from her surgery although she was told she had APCs there is no notation chart from her surgery or anesthesia  EKG:  EKG is  ordered today.  The ekg ordered today demonstrates [facility] frequent APC's with bigeminy and 1 PVC  Recent Labs:   05/29/2017 CMP was normal except creatinine 1.09 GFR 51 cc/min 05/13/2017: BUN 28; Creatinine, Ser 1.04; Hemoglobin 13.0; Platelets 270; Potassium 4.1; Sodium 139  Recent Lipid Panel No results found for: CHOL, TRIG, HDL, CHOLHDL, VLDL, LDLCALC, LDLDIRECT  Physical Exam:    VS:  BP 122/76 (BP Location: Right  Arm, Patient Position: Sitting, Cuff Size: Normal)   Pulse 81   Ht 5\' 6"  (1.676 m)   Wt 130 lb (59 kg)   SpO2 99%   BMI 20.98 kg/m     Wt Readings from Last 3 Encounters:  07/07/17 130 lb (59 kg)  05/16/17 126 lb (57.2 kg)  05/13/17 126 lb (57.2 kg)     GEN:  Well nourished, well developed in no acute distress HEENT: Normal NECK: No JVD; No carotid bruits LYMPHATICS: No lymphadenopathy CARDIAC: RRR, no murmurs, rubs, gallops RESPIRATORY:  Clear to auscultation without rales, wheezing or rhonchi  ABDOMEN: Soft, non-tender, non-distended MUSCULOSKELETAL:  No edema; No deformity  SKIN: Warm and dry NEUROLOGIC:  Alert and oriented x 3 PSYCHIATRIC:  Normal affect     Signed, Norman Herrlich, MD  07/07/2017 10:45 AM    [facility]

## 2017-07-04 DIAGNOSIS — G43109 Migraine with aura, not intractable, without status migrainosus: Secondary | ICD-10-CM

## 2017-07-04 DIAGNOSIS — J309 Allergic rhinitis, unspecified: Secondary | ICD-10-CM

## 2017-07-04 DIAGNOSIS — E785 Hyperlipidemia, unspecified: Secondary | ICD-10-CM

## 2017-07-04 DIAGNOSIS — K219 Gastro-esophageal reflux disease without esophagitis: Secondary | ICD-10-CM | POA: Insufficient documentation

## 2017-07-04 DIAGNOSIS — I10 Essential (primary) hypertension: Secondary | ICD-10-CM | POA: Insufficient documentation

## 2017-07-04 DIAGNOSIS — M810 Age-related osteoporosis without current pathological fracture: Secondary | ICD-10-CM

## 2017-07-04 DIAGNOSIS — I639 Cerebral infarction, unspecified: Secondary | ICD-10-CM | POA: Insufficient documentation

## 2017-07-04 DIAGNOSIS — E871 Hypo-osmolality and hyponatremia: Secondary | ICD-10-CM

## 2017-07-04 DIAGNOSIS — R002 Palpitations: Secondary | ICD-10-CM

## 2017-07-04 HISTORY — DX: Allergic rhinitis, unspecified: J30.9

## 2017-07-04 HISTORY — DX: Gastro-esophageal reflux disease without esophagitis: K21.9

## 2017-07-04 HISTORY — DX: Hyperlipidemia, unspecified: E78.5

## 2017-07-04 HISTORY — DX: Palpitations: R00.2

## 2017-07-04 HISTORY — DX: Hypo-osmolality and hyponatremia: E87.1

## 2017-07-04 HISTORY — DX: Cerebral infarction, unspecified: I63.9

## 2017-07-04 HISTORY — DX: Age-related osteoporosis without current pathological fracture: M81.0

## 2017-07-04 HISTORY — DX: Migraine with aura, not intractable, without status migrainosus: G43.109

## 2017-07-07 ENCOUNTER — Ambulatory Visit: Payer: Medicare Other | Admitting: Cardiology

## 2017-07-07 ENCOUNTER — Encounter: Payer: Self-pay | Admitting: Cardiology

## 2017-07-07 VITALS — BP 122/76 | HR 81 | Ht 66.0 in | Wt 130.0 lb

## 2017-07-07 DIAGNOSIS — I10 Essential (primary) hypertension: Secondary | ICD-10-CM

## 2017-07-07 DIAGNOSIS — I491 Atrial premature depolarization: Secondary | ICD-10-CM | POA: Diagnosis not present

## 2017-07-07 HISTORY — DX: Atrial premature depolarization: I49.1

## 2017-07-07 NOTE — Patient Instructions (Signed)
Medication Instructions:  Your physician recommends that you continue on your current medications as directed. Please refer to the Current Medication list given to you today.   Labwork: Your physician recommends that you have the following labs drawn: TSH   Testing/Procedures: You had an EKG today  Your physician has requested that you have an echocardiogram. Echocardiography is a painless test that uses sound waves to create images of your heart. It provides your doctor with information about the size and shape of your heart and how well your heart's chambers and valves are working. This procedure takes approximately one hour. There are no restrictions for this procedure.  Your physician has recommended that you wear a holter monitor. Holter monitors are medical devices that record the heart's electrical activity. Doctors most often use these monitors to diagnose arrhythmias. Arrhythmias are problems with the speed or rhythm of the heartbeat. The monitor is a small, portable device. You can wear one while you do your normal daily activities. This is usually used to diagnose what is causing palpitations/syncope (passing out). 48 HR      Follow-Up: Your physician recommends that you schedule a follow-up appointment in: 6 weeks in [place]   Any Other Special Instructions Will Be Listed Below (If Applicable).     If you need a refill on your cardiac medications before your next appointment, please call your pharmacy.      1. Avoid all over-the-counter antihistamines except Claritin/Loratadine and Zyrtec/Cetrizine. 2. Avoid all combination including cold sinus allergies flu decongestant and sleep medications 3. You can use Robitussin DM Mucinex and Mucinex DM for cough. 4. can use Tylenol aspirin ibuprofen and naproxen but no combinations such as sleep or sinus. Premature Atrial Contraction A premature atrial contraction [facility](PAC) is a kind of irregular heartbeat (arrhythmia). It  happens when the heart beats too early and then pauses before beating again. PACs are also called skipped heartbeats because they may make you feel like your heart is stopping for a second, even though the heart does not actually skip a beat. The heart has four areas, or chambers. Normally, electrical signals spread across the heart and make all the chambers beat together. During a PAC, the upper chambers of the heart (right atrium and left atrium) beat too early, before they have had time to fill with blood. The heartbeat pauses afterward so the heart can fill with blood for the next beat. What are the causes? The cause of this condition is often unknown. Sometimes it is caused by heart disease or injury to the heart. What increases the risk? This condition is more likely to develop in adults who are 73 years of age or older and in children. Episodes may be triggered by:  Caffeine.  Stress.  Tiredness.  Alcohol.  Smoking.  Stimulant drugs.  Heart disease.  What are the signs or symptoms? Symptoms of this condition include:  A feeling that your heart skipped a beat. The first heartbeat after the "skipped" beat may feel more forceful.  A feeling that your heart is fluttering.  How is this diagnosed? This condition is diagnosed based on:  Your symptoms.  A physical exam. Your health care provider may listen to your heart.  Tests to rule out other conditions, such as a test that records the electrical impulses of the heart and assesses heart health (electrocardiogram, or ECG). If you have an ECG, you may need to wear a portable ECG machine (Holter monitor) that records your heart beats for 24 hours  or more.  How is this treated?  Usually, treatment is not needed for this condition. If you have episodes that happen often or if a cause is found, you may receive treatment for the underlying cause of your PACs. Follow these instructions at home: Lifestyle Follow these instructions  as told by your health care provider:  Do not use any products that contain nicotine or tobacco, such as cigarettes and e-cigarettes. If you need help quitting, ask your health care provider.  If caffeine triggers episodes, do not eat, drink, or use anything with caffeine in it.  If caffeine does not seem to trigger episodes, consume caffeine in moderation.  If alcohol triggers episodes of PAC, do not drink alcohol.  If alcohol does not seem to trigger episodes, limit alcohol intake to no more than 1 drink a day for nonpregnant women and 2 drinks a day for men. One drink equals 12 oz of beer, 5 oz of wine, or 1 oz of hard liquor.  Exercise regularly. Ask your health care provider what type of exercise is safe for you.  Find healthy ways to manage stress. Avoid stressful situations when possible.  Try to get at least 7-9 hours of sleep each night, or as much as recommended by your health care provider.  Do not use illegal drugs.  General instructions  Take over-the-counter and prescription medicines only as told by your health care provider.  Keep all follow-up visits as told by your health care provider. This is important. Contact a health care provider if:  You feel your heart skipping beats more than once a day.  Your heart skips beats and you feel dizzy, light-headed, or very tired. Get help right away if:  You have chest pain.  You have trouble breathing. This information is not intended to replace advice given to you by your health care provider. Make sure you discuss any questions you have with your health care provider. Document Released: 09/10/2013 Document Revised: 09/05/2015 Document Reviewed: 07/07/2015 Elsevier Interactive Patient Education  [employer].

## 2017-07-08 LAB — TSH: TSH: 0.444 u[IU]/mL — AB (ref 0.450–4.500)

## 2017-07-25 ENCOUNTER — Ambulatory Visit (HOSPITAL_BASED_OUTPATIENT_CLINIC_OR_DEPARTMENT_OTHER)
Admission: RE | Admit: 2017-07-25 | Discharge: 2017-07-25 | Disposition: A | Discharge status: Home / Self Care | Payer: Medicare Other | Source: Ambulatory Visit | Attending: Cardiology | Admitting: Cardiology

## 2017-07-25 DIAGNOSIS — I10 Essential (primary) hypertension: Secondary | ICD-10-CM | POA: Diagnosis not present

## 2017-07-25 DIAGNOSIS — I491 Atrial premature depolarization: Secondary | ICD-10-CM | POA: Insufficient documentation

## 2017-07-25 DIAGNOSIS — E785 Hyperlipidemia, unspecified: Secondary | ICD-10-CM | POA: Insufficient documentation

## 2017-07-25 DIAGNOSIS — I340 Nonrheumatic mitral (valve) insufficiency: Secondary | ICD-10-CM | POA: Diagnosis not present

## 2017-07-25 NOTE — Progress Notes (Signed)
  Echocardiogram 2D Echocardiogram has been performed.  Geni Skorupski T Edlin Ford 07/25/2017, 10:46 AM

## 2017-08-04 ENCOUNTER — Ambulatory Visit (INDEPENDENT_AMBULATORY_CARE_PROVIDER_SITE_OTHER): Payer: Medicare Other

## 2017-08-04 DIAGNOSIS — I491 Atrial premature depolarization: Secondary | ICD-10-CM

## 2017-08-13 DIAGNOSIS — I340 Nonrheumatic mitral (valve) insufficiency: Secondary | ICD-10-CM

## 2017-08-13 DIAGNOSIS — I493 Ventricular premature depolarization: Secondary | ICD-10-CM | POA: Insufficient documentation

## 2017-08-13 HISTORY — DX: Nonrheumatic mitral (valve) insufficiency: I34.0

## 2017-08-13 HISTORY — DX: Ventricular premature depolarization: I49.3

## 2017-08-13 NOTE — Progress Notes (Signed)
Cardiology Office Note:    Date:  08/18/2017   ID:  Olivia Blackburn Lawn, DOB 08/26/1945, MRN [id number]  PCP:  Simone CuriaLee, Keung, MD  Cardiologist:  Norman HerrlichBrian Munley, MD    Referring MD: Simone CuriaLee, Keung, MD    ASSESSMENT:    1. APC (atrial premature contractions)   2. Frequent PVCs   3. Non-rheumatic mitral regurgitation    PLAN:    In order of problems listed above:  1. Asymptomatic at this time in the absence of sustained arrhythmia I would not advise an antiarrhythmic drug. 2. Stable see above 3. Clinically mild grade 1/6 murmur of MR as opposed to further evaluation at this time with normal left ventricular function and asymptomatic we will plan a follow-up echocardiogram in 1 year and she will contact me if she is having symptoms of exercise intolerance shortness of breath or palpitation.   Next appointment: One year   Medication Adjustments/Labs and Tests Ordered: Current medicines are reviewed at length with the patient today.  Concerns regarding medicines are outlined above.  No orders of the defined types were placed in this encounter.  No orders of the defined types were placed in this encounter.   No chief complaint on file.   History of Present Illness:    Olivia Blackburn Stockley is a 72 y.o. female with a hx of frequent APC's and PVC's, hypertension , hyperlipidemia and history of previous stroke  last seen 07/07/17  Her echocardiogram 07/25/2017 showed normal left ventricular function was felt to be severe mitral regurgitation however however I reviewed the echocardiogram and felt that it was moderate and moderate left atrial enlargement.  Holter monitor showed 11.7% burden of PVCs with bigeminy and couplets and a 4% burden of APCs without episodes of atrial fibrillation or flutter  Compliance with diet, lifestyle and medications: yes  There is a history of a heart murmur as an adolescent never commented upon since that time she remains very vigorous active does heavy gardening work  has had no exercise intolerance dyspnea chest pain palpitation or syncope. Past Medical History:  Diagnosis Date  . Allergic rhinitis 07/04/2017  . Cerebrovascular accident (CVA) (HCC) 07/04/2017  . GERD (gastroesophageal reflux disease) 07/04/2017  . Hyperlipidemia 07/04/2017  . Hypertension   . Hyponatremia 07/04/2017  . Migraine with prolonged aura, not intractable 07/04/2017  . Osteoporosis 07/04/2017  . Palpitations 07/04/2017    Past Surgical History:  Procedure Laterality Date  . ABDOMINAL HYSTERECTOMY    . CYSTOCELE REPAIR    . TONSILLECTOMY      Current Medications: Current Meds  Medication Sig  . aspirin EC 81 MG tablet Take 81 mg by mouth daily.   . Calcium-Vitamin D-Vitamin K (CALCIUM SOFT CHEWS) 500-500-40 MG-UNT-MCG CHEW Chew 1 tablet by mouth daily.   Marland Kitchen. conjugated estrogens (PREMARIN) vaginal cream Place 1 Applicatorful vaginally every other day.  . denosumab (PROLIA) 60 MG/ML SOLN injection Inject 60 mg into the skin every 6 (six) months. Administer in upper arm, thigh, or abdomen  . lisinopril (PRINIVIL,ZESTRIL) 20 MG tablet Take 20 mg by mouth daily.      Allergies:   Ibuprofen and Azithromycin   Social History   Socioeconomic History  . Marital status: Widowed    Spouse name: Not on file  . Number of children: Not on file  . Years of education: Not on file  . Highest education level: Not on file  Occupational History  . Not on file  Social Needs  . Financial resource strain: Not  on file  . Food insecurity:    Worry: Not on file    Inability: Not on file  . Transportation needs:    Medical: Not on file    Non-medical: Not on file  Tobacco Use  . Smoking status: Never Smoker  . Smokeless tobacco: Never Used  Substance and Sexual Activity  . Alcohol use: Not Currently  . Drug use: Not Currently  . Sexual activity: Not on file  Lifestyle  . Physical activity:    Days per week: Not on file    Minutes per session: Not on file  . Stress: Not on file    Relationships  . Social connections:    Talks on phone: Not on file    Gets together: Not on file    Attends religious service: Not on file    Active member of club or organization: Not on file    Attends meetings of clubs or organizations: Not on file    Relationship status: Not on file  Other Topics Concern  . Not on file  Social History Narrative  . Not on file     Family History: The patient's family history includes Bone cancer in her maternal grandfather; Cerebrovascular Disease in her father; Diabetes in her brother and sister; Heart attack in her brother; Heart disease in her brother and father; Hyperlipidemia in her father, paternal grandfather, and paternal grandmother; Hypertension in her father; Ovarian cancer in her mother; Pancreatic cancer in her sister; Stroke in her brother and father. ROS:   Please see the history of present illness.    All other systems reviewed and are negative.  EKGs/Labs/Other Studies Reviewed:    The following studies were reviewed today:   Recent Labs: 05/13/2017: BUN 28; Creatinine, Ser 1.04; Hemoglobin 13.0; Platelets 270; Potassium 4.1; Sodium 139 07/07/2017: TSH 0.444  Recent Lipid Panel No results found for: CHOL, TRIG, HDL, CHOLHDL, VLDL, LDLCALC, LDLDIRECT  Physical Exam:    VS:  BP 116/76 (BP Location: Right Arm, Patient Position: Sitting, Cuff Size: Normal)   Pulse 78   Ht 5\' 6"  (1.676 m)   Wt 127 lb (57.6 kg)   SpO2 98%   BMI 20.50 kg/m     Wt Readings from Last 3 Encounters:  08/18/17 127 lb (57.6 kg)  07/07/17 130 lb (59 kg)  05/16/17 126 lb (57.2 kg)     GEN:  Well nourished, well developed in no acute distress HEENT: Normal NECK: No JVD; No carotid bruits LYMPHATICS: No lymphadenopathy CARDIAC: RRR, no murmurs, rubs, gallops RESPIRATORY:  Clear to auscultation without rales, wheezing or rhonchi  ABDOMEN: Soft, non-tender, non-distended MUSCULOSKELETAL:  No edema; No deformity  SKIN: Warm and dry NEUROLOGIC:   Alert and oriented x 3 PSYCHIATRIC:  Normal affect    Signed, Norman Herrlich, MD  08/18/2017 9:32 AM    [facility]

## 2017-08-18 ENCOUNTER — Encounter: Payer: Self-pay | Admitting: Cardiology

## 2017-08-18 ENCOUNTER — Ambulatory Visit: Payer: Medicare Other | Admitting: Cardiology

## 2017-08-18 VITALS — BP 116/76 | HR 78 | Ht 66.0 in | Wt 127.0 lb

## 2017-08-18 DIAGNOSIS — I493 Ventricular premature depolarization: Secondary | ICD-10-CM | POA: Diagnosis not present

## 2017-08-18 DIAGNOSIS — I491 Atrial premature depolarization: Secondary | ICD-10-CM

## 2017-08-18 DIAGNOSIS — I340 Nonrheumatic mitral (valve) insufficiency: Secondary | ICD-10-CM

## 2017-08-18 NOTE — Patient Instructions (Signed)

## 2017-10-22 DIAGNOSIS — N3642 Intrinsic sphincter deficiency (ISD): Secondary | ICD-10-CM | POA: Insufficient documentation

## 2017-10-22 HISTORY — DX: Intrinsic sphincter deficiency (ISD): N36.42

## 2018-09-14 NOTE — Progress Notes (Signed)
Cardiology Office Note:    Date:  09/15/2018   ID:  Olivia Blackburn, DOB 03/20/1945, MRN [id number]  PCP:  Simone CuriaLee, Keung, MD  Cardiologist:  Norman HerrlichBrian Munley, MD    Referring MD: Simone CuriaLee, Keung, MD    ASSESSMENT:    1. Frequent PVCs   2. APC (atrial premature contractions)   3. Essential hypertension   4. Non-rheumatic mitral regurgitation    PLAN:    In order of problems listed above:  1. Stable minimal symptoms no EKG findings of PVCs today 2. Stable she has frequent APCs but is not having symptoms I would not put her on antiarrhythmic drug 3. Well-controlled continue her ACE inhibitor 4. Recheck echocardiogram regarding severity mitral regurgitation   Next appointment: 1 year   Medication Adjustments/Labs and Tests Ordered: Current medicines are reviewed at length with the patient today.  Concerns regarding medicines are outlined above.  No orders of the defined types were placed in this encounter.  No orders of the defined types were placed in this encounter.   No chief complaint on file.   History of Present Illness:     Olivia Blackburn is a 73 y.o. female with a hx of frequent APC's and PVC's, hypertension , hyperlipidemia and history of previous stroke    She was last seen 08/18/2017.Her echocardiogram 07/25/2017 showed normal left ventricular function was felt to be severe mitral regurgitation however however I reviewed the echocardiogram and felt that it was moderate and moderate left atrial enlargement.  Holter monitor showed 11.7% burden of PVCs with bigeminy and couplets and a 4% burden of APCs without episodes of atrial fibrillation or flutter   Compliance with diet, lifestyle and medications: Yes  Overall is doing well his vertigo palpitation not severe or bothersome and she notices at times that her Apple Watch tells her she is having atrial bigeminy.  No angina dyspnea or syncope.  She is having no edema or signs of heart failure like orthopnea. Past Medical  History:  Diagnosis Date  . Allergic rhinitis 07/04/2017  . Cerebrovascular accident (CVA) (HCC) 07/04/2017  . GERD (gastroesophageal reflux disease) 07/04/2017  . Hyperlipidemia 07/04/2017  . Hypertension   . Hyponatremia 07/04/2017  . Migraine with prolonged aura, not intractable 07/04/2017  . Osteoporosis 07/04/2017  . Palpitations 07/04/2017    Past Surgical History:  Procedure Laterality Date  . ABDOMINAL HYSTERECTOMY    . CYSTOCELE REPAIR    . TONSILLECTOMY      Current Medications: Current Meds  Medication Sig  . aspirin EC 81 MG tablet Take 81 mg by mouth daily.   . Calcium-Vitamin D-Vitamin K (CALCIUM SOFT CHEWS) 500-500-40 MG-UNT-MCG CHEW Chew 1 tablet by mouth 2 (two) times daily.   Marland Kitchen. conjugated estrogens (PREMARIN) vaginal cream Place 1 Applicatorful vaginally 2 (two) times a week.   . denosumab (PROLIA) 60 MG/ML SOLN injection Inject 60 mg into the skin every 6 (six) months. Administer in upper arm, thigh, or abdomen  . lisinopril (PRINIVIL,ZESTRIL) 20 MG tablet Take 20 mg by mouth daily.      Allergies:   Ibuprofen and Azithromycin   Social History   Socioeconomic History  . Marital status: Widowed    Spouse name: Not on file  . Number of children: Not on file  . Years of education: Not on file  . Highest education level: Not on file  Occupational History  . Not on file  Social Needs  . Financial resource strain: Not on file  . Food insecurity  Worry: Not on file    Inability: Not on file  . Transportation needs    Medical: Not on file    Non-medical: Not on file  Tobacco Use  . Smoking status: Never Smoker  . Smokeless tobacco: Never Used  Substance and Sexual Activity  . Alcohol use: Not Currently  . Drug use: Not Currently  . Sexual activity: Not on file  Lifestyle  . Physical activity    Days per week: Not on file    Minutes per session: Not on file  . Stress: Not on file  Relationships  . Social Herbalist on phone: Not on file     Gets together: Not on file    Attends religious service: Not on file    Active member of club or organization: Not on file    Attends meetings of clubs or organizations: Not on file    Relationship status: Not on file  Other Topics Concern  . Not on file  Social History Narrative  . Not on file     Family History: The patient's family history includes Bone cancer in her maternal grandfather; Cerebrovascular Disease in her father; Diabetes in her brother and sister; Heart attack in her brother; Heart disease in her brother and father; Hyperlipidemia in her father, paternal grandfather, and paternal grandmother; Hypertension in her father; Ovarian cancer in her mother; Pancreatic cancer in her sister; Stroke in her brother and father. ROS:   Please see the history of present illness.    All other systems reviewed and are negative.  EKGs/Labs/Other Studies Reviewed:    The following studies were reviewed today  EKG:  EKG ordered today and personally reviewed.  The ekg ordered today demonstrates SETH atrial bigeminy   Recent Labs: No results found for requested labs within last 8760 hours.  Recent Lipid Panel No results found for: CHOL, TRIG, HDL, CHOLHDL, VLDL, LDLCALC, LDLDIRECT  Physical Exam:    VS:  BP (!) 104/50 (BP Location: Left Arm, Patient Position: Sitting, Cuff Size: Normal)   Pulse 78   Ht 5\' 6"  (1.676 m)   Wt 138 lb 12.8 oz (63 kg)   SpO2 98%   BMI 22.40 kg/m     Wt Readings from Last 3 Encounters:  09/15/18 138 lb 12.8 oz (63 kg)  08/18/17 127 lb (57.6 kg)  07/07/17 130 lb (59 kg)     GEN:  Well nourished, well developed in no acute distress HEENT: Normal NECK: No JVD; No carotid bruits LYMPHATICS: No lymphadenopathy CARDIAC: RRR, no murmurs, rubs, gallops RESPIRATORY:  Clear to auscultation without rales, wheezing or rhonchi  ABDOMEN: Soft, non-tender, non-distended MUSCULOSKELETAL:  No edema; No deformity  SKIN: Warm and dry NEUROLOGIC:  Alert and  oriented x 3 PSYCHIATRIC:  Normal affect    Signed, Shirlee More, MD  09/15/2018 3:25 PM    [facility]

## 2018-09-15 ENCOUNTER — Ambulatory Visit (INDEPENDENT_AMBULATORY_CARE_PROVIDER_SITE_OTHER): Payer: Medicare Other | Admitting: Cardiology

## 2018-09-15 ENCOUNTER — Encounter: Payer: Self-pay | Admitting: Cardiology

## 2018-09-15 ENCOUNTER — Other Ambulatory Visit: Payer: Self-pay

## 2018-09-15 VITALS — BP 104/50 | HR 78 | Ht 66.0 in | Wt 138.8 lb

## 2018-09-15 DIAGNOSIS — I10 Essential (primary) hypertension: Secondary | ICD-10-CM | POA: Diagnosis not present

## 2018-09-15 DIAGNOSIS — I493 Ventricular premature depolarization: Secondary | ICD-10-CM | POA: Diagnosis not present

## 2018-09-15 DIAGNOSIS — I491 Atrial premature depolarization: Secondary | ICD-10-CM | POA: Diagnosis not present

## 2018-09-15 DIAGNOSIS — I340 Nonrheumatic mitral (valve) insufficiency: Secondary | ICD-10-CM

## 2018-09-15 NOTE — Patient Instructions (Signed)
Medication Instructions:  Your physician recommends that you continue on your current medications as directed. Please refer to the Current Medication list given to you today.  If you need a refill on your cardiac medications before your next appointment, please call your pharmacy.   Lab work: None  If you have labs (blood work) drawn today and your tests are completely normal, you will receive your results only by: . MyChart Message (if you have MyChart) OR . A paper copy in the mail If you have any lab test that is abnormal or we need to change your treatment, we will call you to review the results.  Testing/Procedures: You had an EKG today.   Your physician has requested that you have an echocardiogram. Echocardiography is a painless test that uses sound waves to create images of your heart. It provides your doctor with information about the size and shape of your heart and how well your heart's chambers and valves are working. This procedure takes approximately one hour. There are no restrictions for this procedure.  Follow-Up: At [facility], you and your health needs are our priority.  As part of our continuing mission to provide you with exceptional heart care, we have created designated Provider Care Teams.  These Care Teams include your primary Cardiologist (physician) and Advanced Practice Providers (APPs -  Physician Assistants and Nurse Practitioners) who all work together to provide you with the care you need, when you need it. You will need a follow up appointment in 1 years.  Please call our office 2 months in advance to schedule this appointment.      Echocardiogram An echocardiogram is a procedure that uses painless sound waves (ultrasound) to produce an image of the heart. Images from an echocardiogram can provide important information about:  Signs of coronary artery disease (CAD).  Aneurysm detection. An aneurysm is a weak or damaged part of an artery wall that  bulges out from the normal force of blood pumping through the body.  Heart size and shape. Changes in the size or shape of the heart can be associated with certain conditions, including heart failure, aneurysm, and CAD.  Heart muscle function.  Heart valve function.  Signs of a past heart attack.  Fluid buildup around the heart.  Thickening of the heart muscle.  A tumor or infectious growth around the heart valves. Tell a health care provider about:  Any allergies you have.  All medicines you are taking, including vitamins, herbs, eye drops, creams, and over-the-counter medicines.  Any blood disorders you have.  Any surgeries you have had.  Any medical conditions you have.  Whether you are pregnant or may be pregnant. What are the risks? Generally, this is a safe procedure. However, problems may occur, including:  Allergic reaction to dye (contrast) that may be used during the procedure. What happens before the procedure? No specific preparation is needed. You may eat and drink normally. What happens during the procedure?   An IV tube may be inserted into one of your veins.  You may receive contrast through this tube. A contrast is an injection that improves the quality of the pictures from your heart.  A gel will be applied to your chest.  A wand-like tool (transducer) will be moved over your chest. The gel will help to transmit the sound waves from the transducer.  The sound waves will harmlessly bounce off of your heart to allow the heart images to be captured in real-time motion. The images   will be recorded on a computer. The procedure may vary among health care providers and hospitals. What happens after the procedure?  You may return to your normal, everyday life, including diet, activities, and medicines, unless your health care provider tells you not to do that. Summary  An echocardiogram is a procedure that uses painless sound waves (ultrasound) to produce  an image of the heart.  Images from an echocardiogram can provide important information about the size and shape of your heart, heart muscle function, heart valve function, and fluid buildup around your heart.  You do not need to do anything to prepare before this procedure. You may eat and drink normally.  After the echocardiogram is completed, you may return to your normal, everyday life, unless your health care provider tells you not to do that. This information is not intended to replace advice given to you by your health care provider. Make sure you discuss any questions you have with your health care provider. Document Released: 01/05/2000 Document Revised: 04/30/2018 Document Reviewed: 02/10/2016 Elsevier Patient Education  2020 Elsevier Inc.    

## 2018-10-07 ENCOUNTER — Other Ambulatory Visit: Payer: Self-pay

## 2018-10-07 ENCOUNTER — Ambulatory Visit (INDEPENDENT_AMBULATORY_CARE_PROVIDER_SITE_OTHER): Payer: Medicare Other

## 2018-10-07 DIAGNOSIS — I340 Nonrheumatic mitral (valve) insufficiency: Secondary | ICD-10-CM

## 2018-10-07 DIAGNOSIS — I493 Ventricular premature depolarization: Secondary | ICD-10-CM | POA: Diagnosis not present

## 2018-10-07 DIAGNOSIS — I491 Atrial premature depolarization: Secondary | ICD-10-CM | POA: Diagnosis not present

## 2018-10-07 DIAGNOSIS — I10 Essential (primary) hypertension: Secondary | ICD-10-CM

## 2018-10-07 NOTE — Progress Notes (Signed)
Complete echocardiogram has been performed.  Jimmy Joandy Burget RDCS, RVT 

## 2019-01-31 IMAGING — CT CT L SPINE W/O CM
3 series · 12 of 33 positions shown, 14 images · non-contrast
Comparison: 05/13/2017 lumbar spine radiographs.

CLINICAL DATA: 71 y/o  F; fall with back pain.

EXAM:
CT LUMBAR SPINE WITHOUT CONTRAST
TECHNIQUE: Multidetector CT imaging of the lumbar spine was performed without
intravenous contrast administration. Multiplanar CT image
reconstructions were also generated.

[Series 4: l spine st · axial · 0.28mm/px · z∈[+1350,+1502]mm · 4 of 112 slices shown, 5 images]
[im 18/112  soft-tissue]
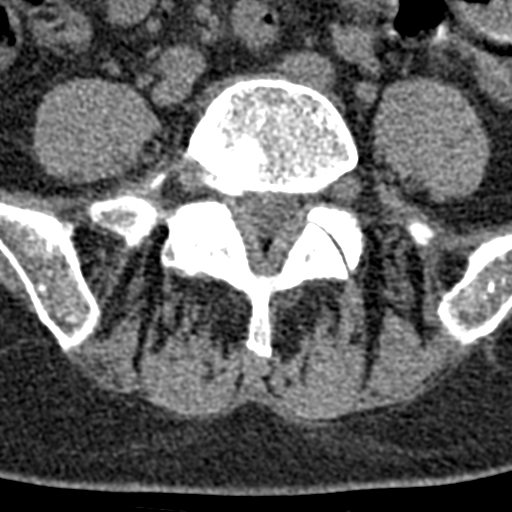
[im 18/112  bone]
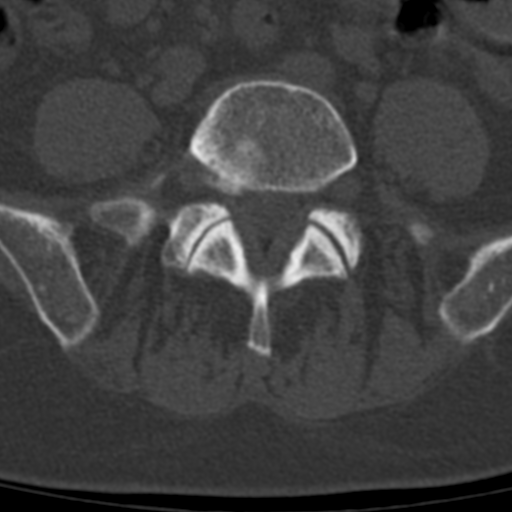
[im 43/112  bone]
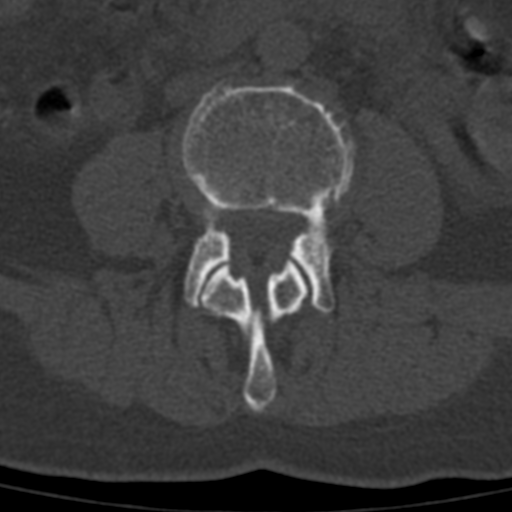
[im 69/112  bone]
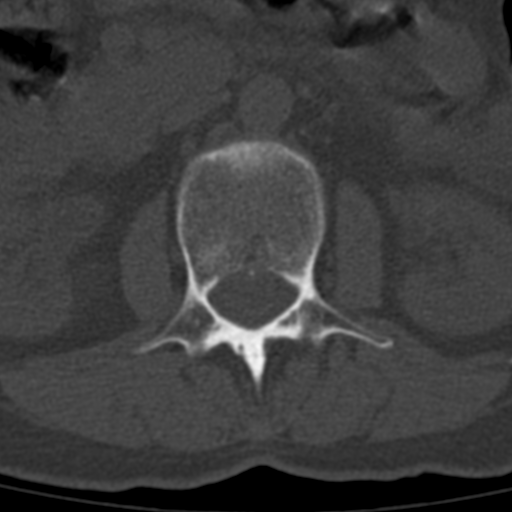
[im 94/112  bone]
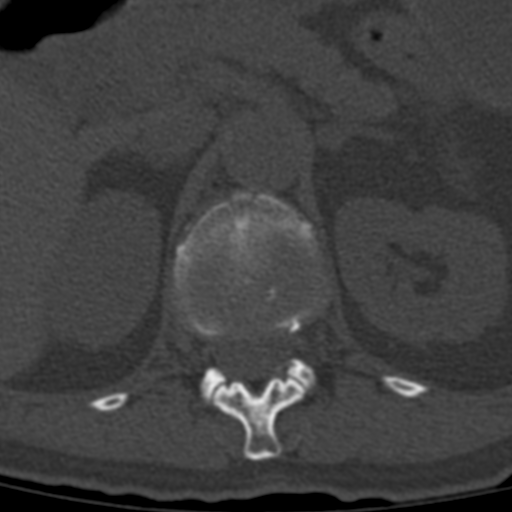

[Series 8: coronal bone · coronal · 0.33mm/px · 3 of 68 slices shown]
[im 14/68  bone]
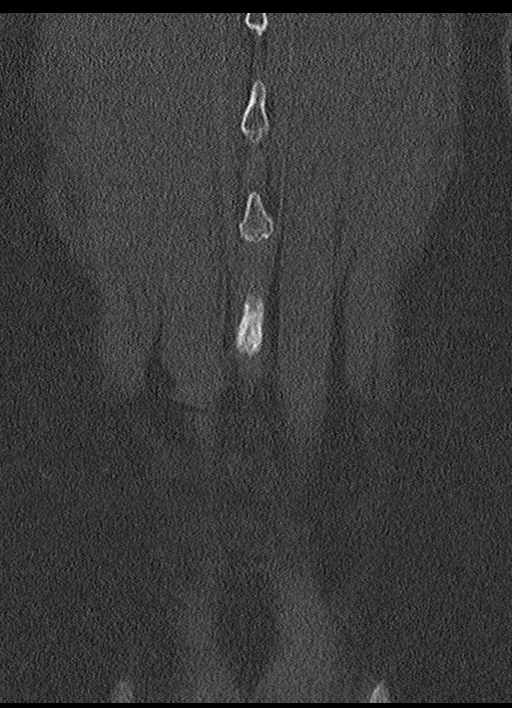
[im 27/68  bone]
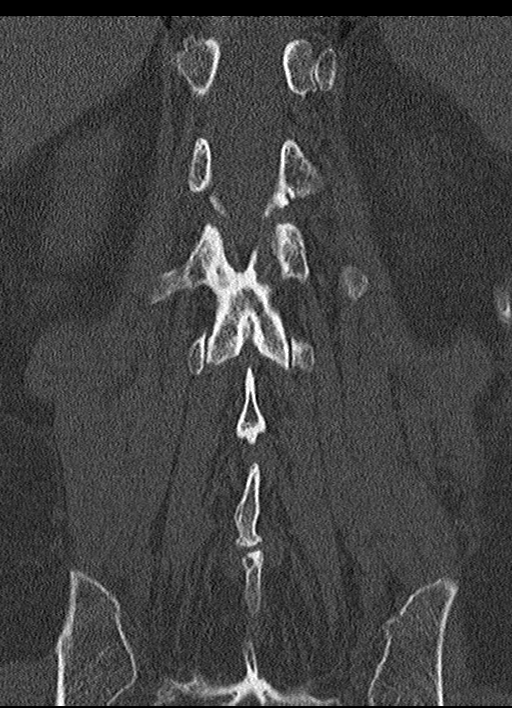
[im 41/68  bone]
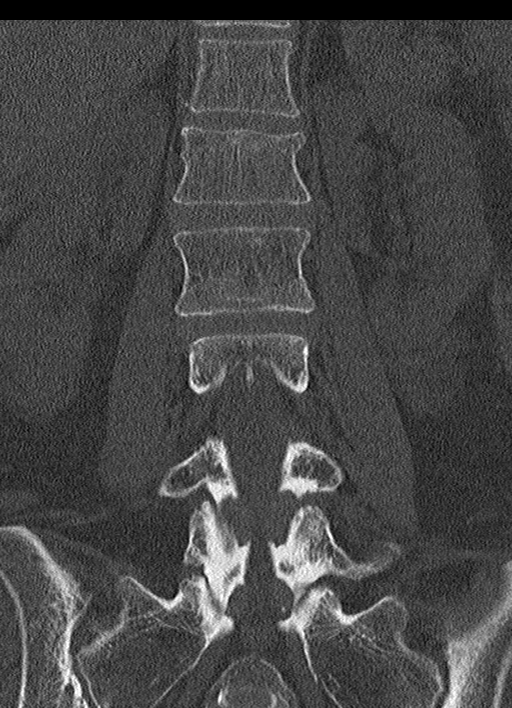

[Series 10: sagittal st · sagittal · 0.27mm/px · 5 of 84 slices shown, 6 images]
[im 28/84  bone]
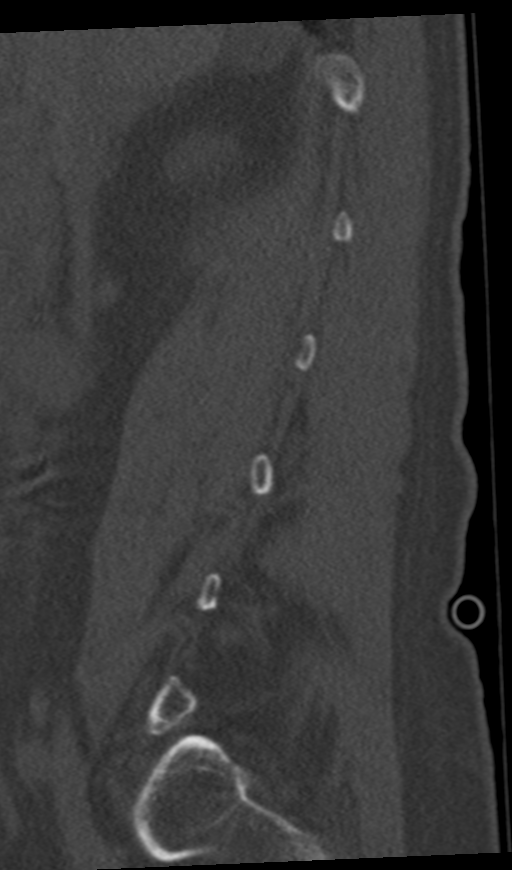
[im 35/84  bone]
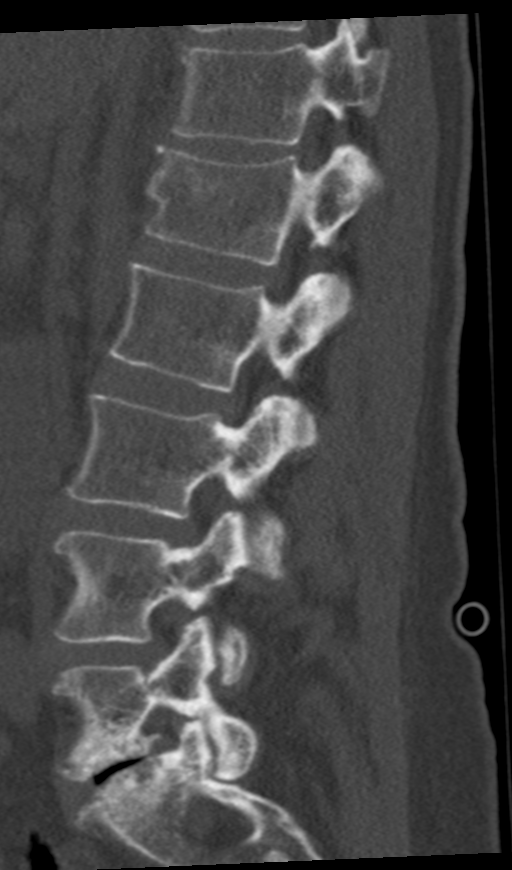
[im 42/84  soft-tissue]
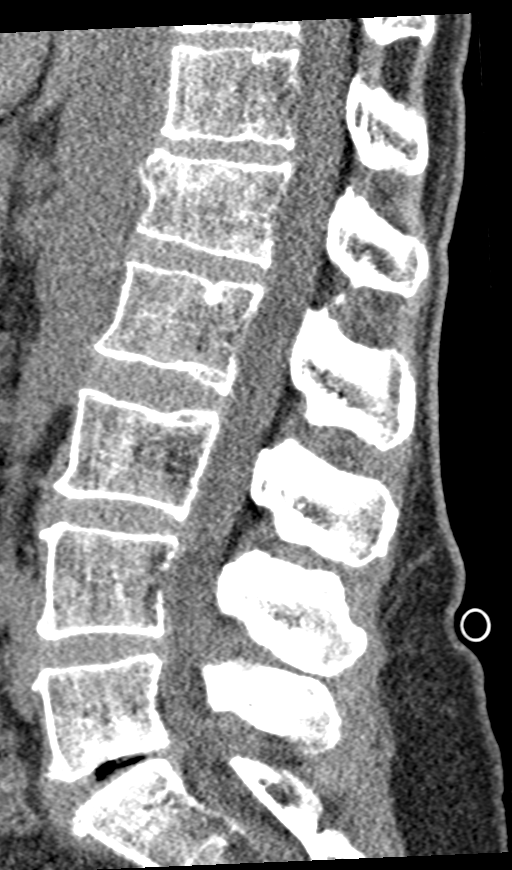
[im 42/84  bone]
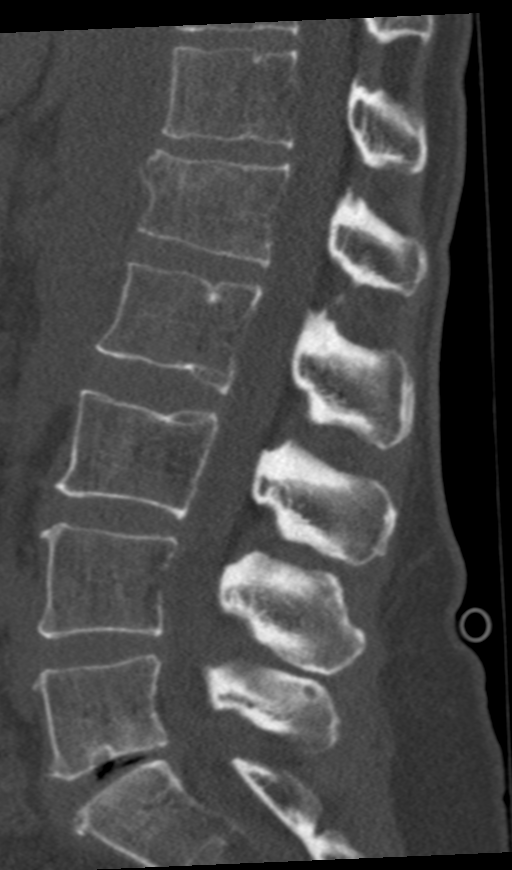
[im 49/84  bone]
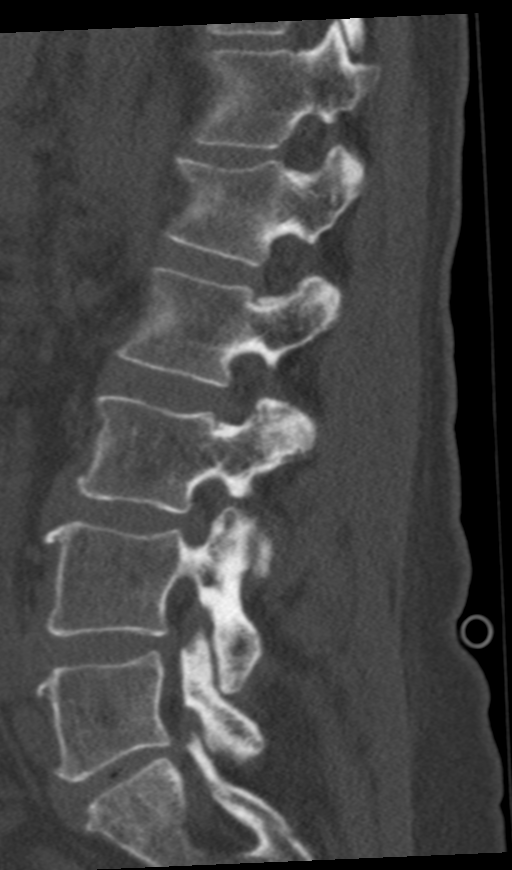
[im 56/84  bone]
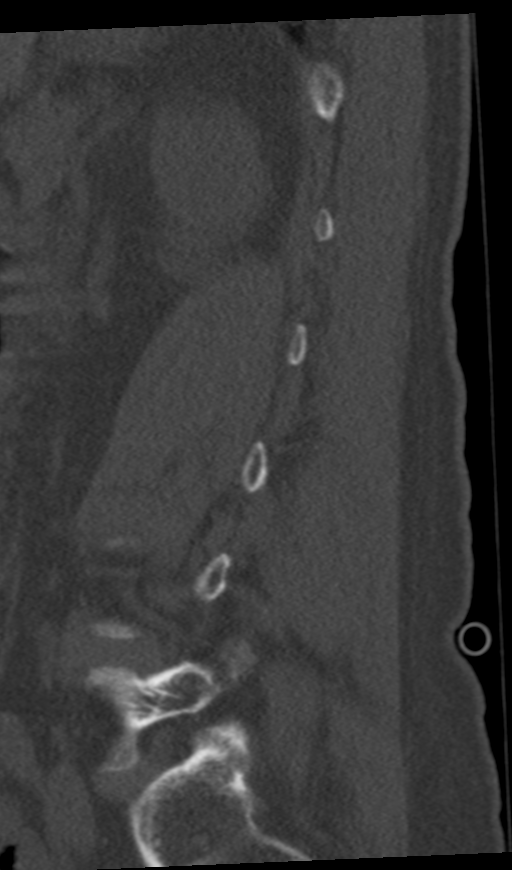

[12 of 33 positions shown; findings below may reference images not displayed]

FINDINGS: Segmentation: 5 lumbar type vertebrae.

Alignment: Mild S-shaped curvature of lumbar spine. Normal lumbar
lordosis without listhesis.

Vertebrae: L1 superior endplate acute anterior compression deformity
with 20% loss of vertebral body height. No other fracture
identified.

Paraspinal and other soft tissues: Right lobe of liver cyst
measuring 25 mm.

Disc levels:

Mild loss of intervertebral disc space height at the L3-4 and L4-5
levels with moderate loss of intervertebral disc space height at
L5-S1. Disc bulges at the L3 through S1 levels result in multilevel
mild foraminal stenosis. At the right L5-S1 level, facet hypertrophy
and endplate marginal osteophytes contribute to moderate right-sided
foraminal stenosis.
IMPRESSION: 1. Acute L1 superior endplate anterior compression deformity with
20% loss of vertebral body height. No other fracture identified.
2. Lumbar spondylosis greatest at L5-S1. No high-grade foraminal or
canal stenosis identified.

By: Baatile Kani M.D.

## 2019-02-15 DIAGNOSIS — E871 Hypo-osmolality and hyponatremia: Secondary | ICD-10-CM | POA: Diagnosis not present

## 2019-02-15 DIAGNOSIS — E46 Unspecified protein-calorie malnutrition: Secondary | ICD-10-CM | POA: Diagnosis not present

## 2019-02-15 DIAGNOSIS — J309 Allergic rhinitis, unspecified: Secondary | ICD-10-CM | POA: Diagnosis not present

## 2019-02-15 DIAGNOSIS — I10 Essential (primary) hypertension: Secondary | ICD-10-CM | POA: Diagnosis not present

## 2019-02-15 DIAGNOSIS — K219 Gastro-esophageal reflux disease without esophagitis: Secondary | ICD-10-CM | POA: Diagnosis not present

## 2019-02-15 DIAGNOSIS — M545 Low back pain: Secondary | ICD-10-CM | POA: Diagnosis not present

## 2019-02-15 DIAGNOSIS — G43109 Migraine with aura, not intractable, without status migrainosus: Secondary | ICD-10-CM | POA: Diagnosis not present

## 2019-02-15 DIAGNOSIS — N959 Unspecified menopausal and perimenopausal disorder: Secondary | ICD-10-CM | POA: Diagnosis not present

## 2019-02-15 DIAGNOSIS — M810 Age-related osteoporosis without current pathological fracture: Secondary | ICD-10-CM | POA: Diagnosis not present

## 2019-02-15 DIAGNOSIS — I679 Cerebrovascular disease, unspecified: Secondary | ICD-10-CM | POA: Diagnosis not present

## 2019-02-15 DIAGNOSIS — E7800 Pure hypercholesterolemia, unspecified: Secondary | ICD-10-CM | POA: Diagnosis not present

## 2019-02-22 ENCOUNTER — Telehealth: Payer: Self-pay | Admitting: Cardiology

## 2019-02-22 NOTE — Telephone Encounter (Signed)
Think is best if she is forced into my office hours tomorrow or Wednesday I prefer tomorrow chest pain is worrisome or worsened to go to med [facility] ED to be seen for high-sensitivity troponin.  Reviewed her office records and I think that she can be seen in the office.  I am not sure I can work her in today as some covering the hospital in the afternoon.

## 2019-02-22 NOTE — Telephone Encounter (Signed)
Pt c/o of Chest Pain: STAT if CP now or developed within 24 hours  1. Are you having CP right now? no  2. Are you experiencing any other symptoms (ex. SOB, nausea, vomiting, sweating)? no  3. How long have you been experiencing CP? Since yesterday  4. Is your CP continuous or coming and going? Comes and goes  5. Have you taken Nitroglycerin? No  Patient states her BP went up Saturday to 140/90 and stayed close to that all day. She says the next day she started having the chest tightness and some tightness in her left arm. She would like a call back to discuss if she should be seen soon and also says she is due for an echo.

## 2019-02-22 NOTE — Progress Notes (Signed)
Cardiology Office Note:    Date:  02/23/2019   ID:  Olivia Blackburn, DOB Jan 17, 1946, MRN [id number]  PCP:  Simone Curia, MD  Cardiologist:  Norman Herrlich, MD    Referring MD: Simone Curia, MD    ASSESSMENT:    1. Chest pain of uncertain etiology   2. APC (atrial premature contractions)   3. Essential hypertension   4. Non-rheumatic mitral regurgitation    PLAN:    In order of problems listed above:  1. 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 2. Stable hypertension continue ACE inhibitor Mitral regurgitation asymptomatic see my note under history  Next appointment: To be determined after quick evaluation for ACS today if high-sensitivity troponin is normal she will need further evaluation and I think we could schedule myocardial perfusion study sooner than cardiac CTA   Medication Adjustments/Labs and Tests Ordered: Current medicines are reviewed at length with the patient today.  Concerns regarding medicines are outlined above.  No orders of the defined types were placed in this encounter.  No orders of the defined types were placed in this encounter.   No chief complaint on file.   History of Present Illness:    Olivia Blackburn is a 74 y.o. female with a hx of mitral regurgitation,frequent APC's and PVC's, hypertension , hyperlipidemia and history of previous stroke  last seen 09/15/2018.  There is a triage call that the patient was having chest discomfort and was worked into the schedule today. Compliance with diet, lifestyle and medications: Yes  Saturday evening she had what she describes as pressure substernally she is clutched posterior chest on Levine sign to describe it lasted for a few hours resolved and since then episodes  wax and wane but not persistent related to activities and relieved with rest.  Reviewed her previous EKGs that show APCs and she has new T wave inversion.  Her symptoms seem to be typical anginal pattern is unstable her EKG abnormal and I told her I think she should go to med [facility] to have evaluation with high sensitive troponin if elevated admission coronary angiography is appropriate.  Till Saturday she had no exercise intolerance shortness of breath edema palpitation or syncope.  Since that date she just does not feel well.  She tells me in the past this occurred to a minor degree but never persistent or bothersome.  Had some usual nasal congestion takes an antihistamine but no symptoms to suggest COVID-19 she has had her first immunization and has her second later in the week.  I reviewed the echo below myself the left ventricle was not dilated 5.3 cm I would define ejection fraction is low normal 50 to 55% in the mitral regurgitation more moderate than severe there is no pulmonary vein reflux present.  That time I called the patient and she was asymptomatic and we decided to recheck in 6 months looking for markers that would cause [place] to consider valvular intervention for asymptomatic mitral regurgitation.  By 2D echo the valve morphology is normal.  Echocardiogram 09/27/2018: 1. The left ventricle has mildly reduced systolic function, with an  ejection fraction of 45-50%. The cavity size was mildly dilated. Left  ventricular diastolic Doppler parameters are consistent with impaired  relaxation. No evidence of  left ventricular  regional wall motion abnormalities.  2. Abnormal Left ventricular global logitudinal strain (-8.8%).  3. The right ventricle has normal systolic function. The cavity was  normal. There is No increase in right ventricular wall thickness. Right  ventricular systolic pressure is normal with an estimated pressure of 20.0  mmHg.  4. Left atrial size was severely  dilated.  5. Right atrial size was normal.  6. Mitral valve regurgitation is severe by color flow Doppler. The MR jet  is posteriorly-directed jet. No evidence of mitral valve stenosis.  7. The pulmonic valve was grossly normal. Pulmonic valve regurgitation is  not visualized by color flow Doppler.  8. The aorta is normal unless otherwise noted.  9. Compared to prior study in 07/25/2017 the Left ventricular ejection  fraction has decrease from 55-60% now to 45-50%. The LA is now severely  dilated and Mitral regurgitation remains severe.   Past Medical History:  Diagnosis Date  . Allergic rhinitis 07/04/2017  . Cerebrovascular accident (CVA) (HCC) 07/04/2017  . GERD (gastroesophageal reflux disease) 07/04/2017  . Hyperlipidemia 07/04/2017  . Hypertension   . Hyponatremia 07/04/2017  . Migraine with prolonged aura, not intractable 07/04/2017  . Osteoporosis 07/04/2017  . Palpitations 07/04/2017    Past Surgical History:  Procedure Laterality Date  . ABDOMINAL HYSTERECTOMY    . CYSTOCELE REPAIR    . TONSILLECTOMY      Current Medications: Current Meds  Medication Sig  . aspirin EC 81 MG tablet Take 81 mg by mouth daily.   . Calcium-Vitamin D-Vitamin K (CALCIUM SOFT CHEWS) 500-500-40 MG-UNT-MCG CHEW Chew 1 tablet by mouth 2 (two) times daily.   Marland Kitchen conjugated estrogens (PREMARIN) vaginal cream Place 1 Applicatorful vaginally 2 (two) times a week.   . denosumab (PROLIA) 60 MG/ML SOLN injection Inject 60 mg into the skin every 6 (six) months. Administer in upper arm, thigh, or abdomen  . lisinopril (PRINIVIL,ZESTRIL) 20 MG tablet Take 20 mg by mouth daily.   Marland Kitchen loratadine (CLARITIN) 10 MG tablet Take by mouth.     Allergies:   Ibuprofen and Azithromycin   Social History   Socioeconomic History  . Marital status: Widowed    Spouse name: Not on file  . Number of children: Not on file  . Years of education: Not on file  . Highest education level: Not on file  Occupational History   . Not on file  Tobacco Use  . Smoking status: Never Smoker  . Smokeless tobacco: Never Used  Substance and Sexual Activity  . Alcohol use: Not Currently  . Drug use: Not Currently  . Sexual activity: Not on file  Other Topics Concern  . Not on file  Social History Narrative  . Not on file   Social Determinants of Health   Financial Resource Strain:   . Difficulty of Paying Living Expenses: Not on file  Food Insecurity:   . Worried About Programme researcher, broadcasting/film/video in the Last Year: Not on file  . Ran Out of Food in the Last Year: Not on file  Transportation Needs:   . Lack of Transportation (Medical): Not on file  . Lack of Transportation (Non-Medical): Not on file  Physical Activity:   . Days of Exercise per Week: Not on file  . Minutes of Exercise per Session: Not on file  Stress:   . Feeling of Stress : Not on file  Social Connections:   . Frequency of Communication with Friends and Family: Not on file  .  Frequency of Social Gatherings with Friends and Family: Not on file  . Attends Religious Services: Not on file  . Active Member of Clubs or Organizations: Not on file  . Attends Archivist Meetings: Not on file  . Marital Status: Not on file     Family History: The patient's family history includes Bone cancer in her maternal grandfather; Cerebrovascular Disease in her father; Diabetes in her brother and sister; Heart attack in her brother; Heart disease in her brother and father; Hyperlipidemia in her father, paternal grandfather, and paternal grandmother; Hypertension in her father; Ovarian cancer in her mother; Pancreatic cancer in her sister; Stroke in her brother and father. ROS:   Please see the history of present illness.    All other systems reviewed and are negative.  EKGs/Labs/Other Studies Reviewed:    The following studies were reviewed today:  EKG:  EKG ordered today and personally reviewed.  The ekg ordered today demonstrates sinus rhythm no ST-T  abnormality that looks ischemic  Recent Labs: No recent labs  Physical Exam:    VS:  BP 104/74   Pulse 74   Ht 5\' 6"  (1.676 m)   Wt 135 lb 12.8 oz (61.6 kg)   SpO2 98%   BMI 21.92 kg/m     Wt Readings from Last 3 Encounters:  02/23/19 135 lb 12.8 oz (61.6 kg)  09/15/18 138 lb 12.8 oz (63 kg)  08/18/17 127 lb (57.6 kg)     GEN:  Well nourished, well developed in no acute distress HEENT: Normal NECK: No JVD; No carotid bruits LYMPHATICS: No lymphadenopathy CARDIAC: Grade 1/6 murmur of mitral regurgitation RRR, no murmurs, rubs, gallops RESPIRATORY:  Clear to auscultation without rales, wheezing or rhonchi  ABDOMEN: Soft, non-tender, non-distended MUSCULOSKELETAL:  No edema; No deformity  SKIN: Warm and dry NEUROLOGIC:  Alert and oriented x 3 PSYCHIATRIC:  Normal affect    Signed, Shirlee More, MD  02/23/2019 9:45 AM    [facility]

## 2019-02-22 NOTE — Telephone Encounter (Signed)
Pt agrees to be seen tomorrow (2/2) @ 1020. She understands if her CP worsens or becomes more worrisome, she should report to [facility] for evaluation. She verbalized understanding and had no additional questions.

## 2019-02-22 NOTE — Telephone Encounter (Signed)
Pt calling today with c/o increased BP over the weekend. She states her BP was in the 104's/90's all weekend accompanied with a HA and chest tightness. She states she took a zyrtec on Friday night and believes this increased her BP. Today her BP measured 117/74. She does not currently have CP, but does have chest tightness upon exertion. For the last 3 months, she states she has had mid-chest "soreness." She would like to know if she needs a sooner appt for evaluation.   I advised her I did not see any appointment slots this week for Dr. Dulce Sellar. I will forward to him and his covering RN to discuss scheduling patient in an acute spot if appropriate.  Pt was advised to seek ED care if she has increase in CP symptoms. She verbalized understanding and had no additional questions.

## 2019-02-23 ENCOUNTER — Encounter (HOSPITAL_BASED_OUTPATIENT_CLINIC_OR_DEPARTMENT_OTHER): Payer: Self-pay | Admitting: Emergency Medicine

## 2019-02-23 ENCOUNTER — Telehealth: Payer: Self-pay | Admitting: Cardiology

## 2019-02-23 ENCOUNTER — Encounter: Payer: Self-pay | Admitting: Cardiology

## 2019-02-23 ENCOUNTER — Emergency Department (HOSPITAL_BASED_OUTPATIENT_CLINIC_OR_DEPARTMENT_OTHER): Payer: Medicare PPO

## 2019-02-23 ENCOUNTER — Telehealth: Payer: Self-pay

## 2019-02-23 ENCOUNTER — Emergency Department (HOSPITAL_BASED_OUTPATIENT_CLINIC_OR_DEPARTMENT_OTHER)
Admission: EM | Admit: 2019-02-23 | Discharge: 2019-02-23 | Disposition: A | Discharge status: Home / Self Care | Payer: Medicare PPO | Attending: Emergency Medicine | Admitting: Emergency Medicine

## 2019-02-23 ENCOUNTER — Ambulatory Visit (INDEPENDENT_AMBULATORY_CARE_PROVIDER_SITE_OTHER): Payer: Medicare PPO | Admitting: Cardiology

## 2019-02-23 ENCOUNTER — Other Ambulatory Visit: Payer: Self-pay

## 2019-02-23 VITALS — BP 104/74 | HR 74 | Ht 66.0 in | Wt 135.8 lb

## 2019-02-23 DIAGNOSIS — I10 Essential (primary) hypertension: Secondary | ICD-10-CM

## 2019-02-23 DIAGNOSIS — Z881 Allergy status to other antibiotic agents status: Secondary | ICD-10-CM | POA: Diagnosis not present

## 2019-02-23 DIAGNOSIS — R0789 Other chest pain: Secondary | ICD-10-CM | POA: Diagnosis not present

## 2019-02-23 DIAGNOSIS — E785 Hyperlipidemia, unspecified: Secondary | ICD-10-CM | POA: Insufficient documentation

## 2019-02-23 DIAGNOSIS — Z7982 Long term (current) use of aspirin: Secondary | ICD-10-CM | POA: Diagnosis not present

## 2019-02-23 DIAGNOSIS — R079 Chest pain, unspecified: Secondary | ICD-10-CM

## 2019-02-23 DIAGNOSIS — Z886 Allergy status to analgesic agent status: Secondary | ICD-10-CM | POA: Insufficient documentation

## 2019-02-23 DIAGNOSIS — R072 Precordial pain: Secondary | ICD-10-CM | POA: Diagnosis not present

## 2019-02-23 DIAGNOSIS — E871 Hypo-osmolality and hyponatremia: Secondary | ICD-10-CM | POA: Diagnosis not present

## 2019-02-23 DIAGNOSIS — Z79899 Other long term (current) drug therapy: Secondary | ICD-10-CM | POA: Diagnosis not present

## 2019-02-23 DIAGNOSIS — I491 Atrial premature depolarization: Secondary | ICD-10-CM

## 2019-02-23 DIAGNOSIS — Z8673 Personal history of transient ischemic attack (TIA), and cerebral infarction without residual deficits: Secondary | ICD-10-CM | POA: Insufficient documentation

## 2019-02-23 DIAGNOSIS — I340 Nonrheumatic mitral (valve) insufficiency: Secondary | ICD-10-CM | POA: Diagnosis not present

## 2019-02-23 LAB — BASIC METABOLIC PANEL
Anion gap: 10 (ref 5–15)
BUN: 26 mg/dL — ABNORMAL HIGH (ref 8–23)
CO2: 26 mmol/L (ref 22–32)
Calcium: 9.7 mg/dL (ref 8.9–10.3)
Chloride: 88 mmol/L — ABNORMAL LOW (ref 98–111)
Creatinine, Ser: 1.09 mg/dL — ABNORMAL HIGH (ref 0.44–1.00)
GFR calc Af Amer: 58 mL/min — ABNORMAL LOW (ref >60)
GFR calc non Af Amer: 50 mL/min — ABNORMAL LOW (ref >60)
Glucose, Bld: 87 mg/dL (ref 70–99)
Potassium: 4 mmol/L (ref 3.5–5.1)
Sodium: 124 mmol/L — ABNORMAL LOW (ref 135–145)

## 2019-02-23 LAB — CBC
HCT: 43 % (ref 36.0–46.0)
Hemoglobin: 14.8 g/dL (ref 12.0–15.0)
MCH: 30.9 pg (ref 26.0–34.0)
MCHC: 34.4 g/dL (ref 30.0–36.0)
MCV: 89.8 fL (ref 80.0–100.0)
Platelets: 244 10*3/uL (ref 150–400)
RBC: 4.79 MIL/uL (ref 3.87–5.11)
RDW: 12.1 % (ref 11.5–15.5)
WBC: 6.6 10*3/uL (ref 4.0–10.5)
nRBC: 0 % (ref 0.0–0.2)

## 2019-02-23 LAB — TROPONIN I (HIGH SENSITIVITY): Troponin I (High Sensitivity): 3 ng/L (ref <18)

## 2019-02-23 MED ORDER — NITROGLYCERIN 0.4 MG SL SUBL: 0.4000 mg | SUBLINGUAL_TABLET | SUBLINGUAL | 0 refills | Status: AC | PRN

## 2019-02-23 MED ORDER — SODIUM CHLORIDE 0.9 % IV BOLUS
500.0000 mL | Freq: Once | INTRAVENOUS | Status: AC
  Administered 2019-02-23: 500 mL via INTRAVENOUS

## 2019-02-23 MED ORDER — METOPROLOL TARTRATE 25 MG PO TABS: 50.0000 mg | ORAL_TABLET | Freq: Two times a day (BID) | ORAL | 0 refills | Status: DC

## 2019-02-23 NOTE — Discharge Instructions (Signed)
You were evaluated in the emergency department for chest pain.  He had blood work EKG and a chest x-ray that did not show evidence of a heart attack.  Your cardiologist is going to set you up for further tests.  He wanted you to start on Lopressor and have nitroglycerin available if your pain recurred.  Your testing also showed that your sodium was low.  You were given some IV fluids here and will need to have your sodium rechecked this week.  Please contact your primary care doctor for this.  Return to the emergency department if any worsening or concerning symptoms.

## 2019-02-23 NOTE — Telephone Encounter (Signed)
Patient states she is still in the ED and can not talk right now. Patient told to call office back to discuss lexi and echo that have been scheduled.

## 2019-02-23 NOTE — ED Provider Notes (Signed)
[facility] EMERGENCY DEPARTMENT Provider Note   CSN: [id number] Arrival date & time: 02/23/19  1119     History Chief Complaint  Patient presents with  . Chest Pain    Olivia Blackburn is a 74 y.o. female.  She was sent from Dr. Hulen Shouts office cardiology for evaluation of chest discomfort.  She said she has had on and off discomfort lasting hours at a time that have been going on for the last 3 days.  At its worst it was 4 out of 10 substernal pressure.  Currently has 0 out of 10 chest pain.  Did radiate into the left arm.  Not associate with any diaphoresis dizziness shortness of breath nausea vomiting.  Does have a history of reflux.  No known coronary disease.  The history is provided by the patient.  Chest Pain Pain location:  Substernal area Pain quality: aching   Pain radiates to:  L shoulder Onset quality:  Gradual Duration:  4 days Timing:  Intermittent Progression:  Unchanged Chronicity:  New Relieved by:  Nothing Worsened by:  Exertion Ineffective treatments:  None tried Associated symptoms: no abdominal pain, no altered mental status, no claudication, no cough, no diaphoresis, no dizziness, no fever, no headache, no lower extremity edema, no nausea, no shortness of breath and no vomiting   Risk factors: high cholesterol and hypertension   Risk factors: no smoking     HPI: A 74 year old patient with a history of CVA, hypertension and hypercholesterolemia presents for evaluation of chest pain. Initial onset of pain was more than 6 hours ago. The patient's chest pain is described as heaviness/pressure/tightness and is worse with exertion. The patient's chest pain is middle- or left-sided, is not well-localized, is not sharp and does radiate to the arms/jaw/neck. The patient does not complain of nausea and denies diaphoresis. The patient has no history of peripheral artery disease, has not smoked in the past 90 days, denies any history of treated diabetes, has no  relevant family history of coronary artery disease (first degree relative at less than age 61) and does not have an elevated BMI (>=30).   Past Medical History:  Diagnosis Date  . Allergic rhinitis 07/04/2017  . Cerebrovascular accident (CVA) ([facility]) 07/04/2017  . GERD (gastroesophageal reflux disease) 07/04/2017  . Hyperlipidemia 07/04/2017  . Hypertension   . Hyponatremia 07/04/2017  . Migraine with prolonged aura, not intractable 07/04/2017  . Osteoporosis 07/04/2017  . Palpitations 07/04/2017    Patient Active Problem List   Diagnosis Date Noted  . Frequent PVCs 08/13/2017  . Mitral regurgitation 08/13/2017  . APC (atrial premature contractions) 07/07/2017  . Palpitations 07/04/2017  . GERD (gastroesophageal reflux disease) 07/04/2017  . Hypertension 07/04/2017  . Cerebrovascular accident (CVA) ([facility]) 07/04/2017  . Osteoporosis 07/04/2017  . Hyperlipidemia 07/04/2017  . Hyponatremia 07/04/2017  . Allergic rhinitis 07/04/2017  . Migraine with prolonged aura, not intractable 07/04/2017  . Closed fracture of base of fifth metatarsal bone 05/16/2017  . Sprain of ankle 05/16/2017    Past Surgical History:  Procedure Laterality Date  . ABDOMINAL HYSTERECTOMY    . CYSTOCELE REPAIR    . TONSILLECTOMY       OB History   No obstetric history on file.     Family History  Problem Relation Age of Onset  . Ovarian cancer Mother   . Heart disease Father   . Stroke Father   . Cerebrovascular Disease Father   . Hypertension Father   . Hyperlipidemia Father   .  Pancreatic cancer Sister   . Diabetes Sister   . Heart disease Brother   . Stroke Brother   . Diabetes Brother   . Heart attack Brother   . Bone cancer Maternal Grandfather   . Hyperlipidemia Paternal Grandmother   . Hyperlipidemia Paternal Grandfather     Social History   Tobacco Use  . Smoking status: Never Smoker  . Smokeless tobacco: Never Used  Substance Use Topics  . Alcohol use: Not Currently  . Drug use:  Not Currently    Home Medications Prior to Admission medications   Medication Sig Start Date End Date Taking? Authorizing Provider  aspirin EC 81 MG tablet Take 81 mg by mouth daily.  08/06/11   [provider]  Calcium-Vitamin D-Vitamin K (CALCIUM SOFT CHEWS) 500-500-40 MG-UNT-MCG CHEW Chew 1 tablet by mouth 2 (two) times daily.  08/06/11   [provider]  conjugated estrogens (PREMARIN) vaginal cream Place 1 Applicatorful vaginally 2 (two) times a week.     [provider]  denosumab (PROLIA) 60 MG/ML SOLN injection Inject 60 mg into the skin every 6 (six) months. Administer in upper arm, thigh, or abdomen    [provider]  lisinopril (PRINIVIL,ZESTRIL) 20 MG tablet Take 20 mg by mouth daily.  02/19/17   [provider]  loratadine (CLARITIN) 10 MG tablet Take by mouth. 08/06/11   [provider]    Allergies    Ibuprofen and Azithromycin  Review of Systems   Review of Systems  Constitutional: Negative for diaphoresis and fever.  HENT: Negative for sore throat.   Eyes: Negative for visual disturbance.  Respiratory: Negative for cough and shortness of breath.   Cardiovascular: Positive for chest pain. Negative for claudication.  Gastrointestinal: Negative for abdominal pain, nausea and vomiting.  Genitourinary: Negative for dysuria.  Musculoskeletal: Negative for neck pain.  Skin: Negative for rash.  Neurological: Negative for dizziness and headaches.    Physical Exam Updated Vital Signs BP 135/79   Pulse 73   Temp 98 F (36.7 C) (Oral)   Resp 14   Ht 5\' 6"  (1.676 m)   Wt 61.6 kg   SpO2 99%   BMI 21.92 kg/m   Physical Exam Vitals and nursing note reviewed.  Constitutional:      General: She is not in acute distress.    Appearance: She is well-developed.  HENT:     Head: Normocephalic and atraumatic.  Eyes:     Conjunctiva/sclera: Conjunctivae normal.  Cardiovascular:     Rate and Rhythm: Normal rate and  regular rhythm.     Heart sounds: Normal heart sounds. No murmur.  Pulmonary:     Effort: Pulmonary effort is normal. No respiratory distress.     Breath sounds: Normal breath sounds.  Abdominal:     Palpations: Abdomen is soft.     Tenderness: There is no abdominal tenderness.  Musculoskeletal:        General: Normal range of motion.     Cervical back: Neck supple.     Right lower leg: No tenderness. No edema.     Left lower leg: No tenderness. No edema.  Skin:    General: Skin is warm and dry.     Capillary Refill: Capillary refill takes less than 2 seconds.  Neurological:     General: No focal deficit present.     Mental Status: She is alert.     ED Results / Procedures / Treatments   Labs (all labs ordered are listed,  but only abnormal results are displayed) Labs Reviewed  BASIC METABOLIC PANEL - Abnormal; Notable for the following components:      Result Value   Sodium 124 (*)    Chloride 88 (*)    BUN 26 (*)    Creatinine, Ser 1.09 (*)    GFR calc non Af Amer 50 (*)    GFR calc Af Amer 58 (*)    All other components within normal limits  CBC  TROPONIN I (HIGH SENSITIVITY)    EKG EKG Interpretation  Date/Time:  Tuesday February 23 2019 11:26:11 EST Ventricular Rate:  73 PR Interval:    QRS Duration: 103 QT Interval:  379 QTC Calculation: 418 R Axis:   2 Text Interpretation: Sinus rhythm Left atrial enlargement Baseline wander in lead(s) V2 No old tracing to compare Confirmed by Aletta Edouard [phone]) on 02/23/2019 11:30:30 AM   Radiology DG Chest Port 1 View  Result Date: 02/23/2019 CLINICAL DATA:  Intermittent chest pain for 4 days EXAM: PORTABLE CHEST 1 VIEW COMPARISON:  None. FINDINGS: The heart size and mediastinal contours are within normal limits. Both lungs are clear. The visualized skeletal structures are unremarkable. IMPRESSION: No active disease. Electronically Signed   By: Randa Ngo M.D.   On: 02/23/2019 11:59    Procedures Procedures  (including critical care time)  Medications Ordered in ED Medications  sodium chloride 0.9 % bolus 500 mL ( Intravenous Stopped 02/23/19 1359)    ED Course  I have reviewed the triage vital signs and the nursing notes.  Pertinent labs & imaging results that were available during my care of the patient were reviewed by me and considered in my medical decision making (see chart for details).  Clinical Course as of Feb 22 1654  Tue Feb 23, 2019  1145 Intermittent chest pain sometimes associated with exertion sometimes at rest new since the last 4 days.  Differential includes ACS, unstable angina, GERD, pneumonia, pneumothorax, vascular disease   [MB]  1201 Chest x-ray interpreted by me as no pneumothorax no gross infiltrates.  Awaiting radiology reading.   [MB]  1226 I messaged the patient's cardiologist Dr. Bettina Gavia and reviewed the findings with him.  He said from a cardiac standpoint he was fine with her going home and will have the office schedule her nuclear study.  He recommended beta-blocker and nitroglycerin scripts.  He had no explanation for her hyponatremia.  Not on any diuretics.  I asked the patient about this and she said she has no recollection of being told her sodium was low.  I only have one prior value to compare with and that was normal at that time.  We will give her some IV fluids.   [MB]    Clinical Course User Index [MB] Hayden Rasmussen, MD   MDM Rules/Calculators/A&P HEAR Score: 7                    Final Clinical Impression(s) / ED Diagnoses Final diagnoses:  Nonspecific chest pain  Hyponatremia    Rx / DC Orders ED Discharge Orders         Ordered    metoprolol tartrate (LOPRESSOR) 25 MG tablet  2 times daily     02/23/19 1237    nitroGLYCERIN (NITROSTAT) 0.4 MG SL tablet  Every 5 min PRN     02/23/19 1237           Hayden Rasmussen, MD 02/23/19 1657

## 2019-02-23 NOTE — Telephone Encounter (Signed)
Follow up: ° ° ° °Patient returning your call. Please call patient back. °

## 2019-02-23 NOTE — Telephone Encounter (Signed)
RN discussed upcoming lexi instructions and echo. No further questions

## 2019-02-23 NOTE — ED Triage Notes (Signed)
Pt referred from Dr. Hulen Shouts office in [place].  Pt started to have mid sternal chest pain on Saturday which resolved in a few hours, then started to occur intermittently.  Occasionally she would feel something in her right arm.  Pt admits to having frequent episodes of pain on Sunday, but much less yesterday and today.  Denies pain currently, denies sob or N/V.  No diaphoresis.  No edema.

## 2019-03-02 ENCOUNTER — Telehealth (HOSPITAL_COMMUNITY): Payer: Self-pay | Admitting: *Deleted

## 2019-03-02 NOTE — Telephone Encounter (Signed)
Patient given detailed instructions per Myocardial Perfusion Study Information Sheet for the test on 03/08/19 at 10:15. Patient notified to arrive 15 minutes early and that it is imperative to arrive on time for appointment to keep from having the test rescheduled.  If you need to cancel or reschedule your appointment, please call the office within 24 hours of your appointment. . Patient verbalized understanding.Olivia Blackburn

## 2019-03-08 ENCOUNTER — Ambulatory Visit (HOSPITAL_COMMUNITY): Payer: Medicare PPO | Attending: Cardiovascular Disease

## 2019-03-08 ENCOUNTER — Ambulatory Visit (HOSPITAL_BASED_OUTPATIENT_CLINIC_OR_DEPARTMENT_OTHER): Payer: Medicare PPO

## 2019-03-08 ENCOUNTER — Other Ambulatory Visit: Payer: Self-pay

## 2019-03-08 VITALS — Ht 66.0 in | Wt 135.0 lb

## 2019-03-08 DIAGNOSIS — R079 Chest pain, unspecified: Secondary | ICD-10-CM

## 2019-03-08 DIAGNOSIS — I10 Essential (primary) hypertension: Secondary | ICD-10-CM

## 2019-03-08 DIAGNOSIS — I493 Ventricular premature depolarization: Secondary | ICD-10-CM | POA: Insufficient documentation

## 2019-03-08 DIAGNOSIS — I491 Atrial premature depolarization: Secondary | ICD-10-CM | POA: Diagnosis not present

## 2019-03-08 LAB — MYOCARDIAL PERFUSION IMAGING
LV dias vol: 67 mL (ref 46–106)
LV sys vol: 26 mL
Peak HR: 87 {beats}/min
Rest HR: 53 {beats}/min
SDS: 1
SRS: 0
SSS: 1
TID: 0.99

## 2019-03-08 LAB — ECHOCARDIOGRAM COMPLETE
Height: 66 in
Weight: 2160 oz

## 2019-03-08 MED ORDER — TECHNETIUM TC 99M TETROFOSMIN IV KIT
9.7000 | PACK | Freq: Once | INTRAVENOUS | Status: AC | PRN
  Administered 2019-03-08: 9.7 via INTRAVENOUS
  Filled 2019-03-08: qty 10

## 2019-03-08 MED ORDER — REGADENOSON 0.4 MG/5ML IV SOLN
0.4000 mg | Freq: Once | INTRAVENOUS | Status: AC
  Administered 2019-03-08: 0.4 mg via INTRAVENOUS

## 2019-03-08 MED ORDER — TECHNETIUM TC 99M TETROFOSMIN IV KIT
31.6000 | PACK | Freq: Once | INTRAVENOUS | Status: AC | PRN
  Administered 2019-03-08: 31.6 via INTRAVENOUS
  Filled 2019-03-08: qty 32

## 2019-03-17 DIAGNOSIS — N952 Postmenopausal atrophic vaginitis: Secondary | ICD-10-CM | POA: Diagnosis not present

## 2019-04-12 NOTE — Progress Notes (Signed)
Cardiology Office Note:    Date:  04/13/2019   ID:  Olivia Blackburn, DOB 1945-06-05, MRN [id number]  PCP:  Simone Curia, MD  Cardiologist:  Norman Herrlich, MD    Referring MD: Simone Curia, MD    ASSESSMENT:    1. Chest pain of uncertain etiology   2. Essential hypertension   3. Non-rheumatic mitral regurgitation   4. Frequent PVCs    PLAN:    In order of problems listed above:  1. Improved atypical symptoms most consistent with costochondral pain.  At this time with a normal perfusion study and high-sensitivity troponin I do no further cardiac evaluation. 2. BP at target tolerates ACE inhibitor without side effect and continue the same 3. Stable neurologic regurgitation is not severe at this time would not advise intervention 4. Stable and asymptomatic   Next appointment: 1 year   Medication Adjustments/Labs and Tests Ordered: Current medicines are reviewed at length with the patient today.  Concerns regarding medicines are outlined above.  No orders of the defined types were placed in this encounter.  No orders of the defined types were placed in this encounter.   Chief Complaint  Patient presents with  . Follow-up    Myocardial perfusion test results    History of Present Illness:    Olivia Blackburn is a 73 y.o. female with a hx of  frequent APC's and PVC's, hypertension , hyperlipidemia and history of previous stroke    She was last seen 08/18/2017.Her echocardiogram 07/25/2017 showed normal left ventricular function was felt to be severe mitral regurgitation however however I reviewed the echocardiogram and felt that it was moderate and moderate left atrial enlargement.  Holter monitor showed 11.7% burden of PVCs with bigeminy and couplets and a 4% burden of APCs without episodes of atrial fibrillation or flutter last seen 02/23/2019 with chest pain. Compliance with diet, lifestyle and medications: Yes  Myoview 03/08/2019 in dependently reviewed: Nuclear Stress  Findings Isotope administration Rest isotope was administered  with an IV injection of 9.7 mCi Tc29m Tetrofosmin.  Rest SPECT images were obtained approximately 45 minutes post tracer injection.  Stress isotope was administered  with an IV injection of 31.6 mCi Tc26m Tetrofosmin   Stress SPECT images were obtained approximately 60 minutes post tracer injection.  Nuclear Study Quality Overall image quality is good.  Nuclear Measurements Study was gated.  Perfusion Summary Normal resting and stress perfusion. No ischemia or infarction EF 62%  Overall Study Impression Myocardial perfusion is normal.   The study is normal.   This is a low risk study.  Overall left ventricular systolic function was normal.    LV cavity size is normal.  Nuclear stress EF:  62%.  The left ventricular ejection fraction is normal (55-65%). There is no prior study for comparison.   Echocardiogram 03/08/2019: She has no elements of severe mitral regurgitation.  Reviewed the study 1. Left ventricular ejection fraction, by estimation, is 55%. The left  ventricle has low normal function. The left ventricle has no regional wall  motion abnormalities. Left ventricular diastolic parameters are consistent  with Grade II diastolic  dysfunction (pseudonormalization).  2. Right ventricular systolic function is normal. The right ventricular  size is normal. There is normal pulmonary artery systolic pressure. The  estimated right ventricular systolic pressure is 27.0 mmHg.  3. Left atrial size was mild to moderately dilated.  4. The mitral valve is normal in structure and function. Moderate central  mitral valve regurgitation, appears functional. No  evidence of mitral  stenosis.  5. The aortic valve is tricuspid. Aortic valve regurgitation is not  visualized. No aortic stenosis is present.  6. The inferior vena cava is normal in size with greater than 50%  respiratory variability, suggesting right atrial pressure of 3 mmHg.    She is improved when she exercises at times she gets very localized sharp momentary left costochondral discomfort.  No exertional angina shortness of breath palpitations syncope or edema.  We reviewed her myocardial perfusion study was normal and echocardiogram but does not show severe mitral regurgitation feel that her chest pain was atypical musculoskeletal costochondral and does not require further cardiac evaluation at this time.  She has no edema orthopnea shortness of breath no palpitation or syncope Past Medical History:  Diagnosis Date  . Allergic rhinitis 07/04/2017  . Cerebrovascular accident (CVA) ([place]) 07/04/2017  . GERD (gastroesophageal reflux disease) 07/04/2017  . Hyperlipidemia 07/04/2017  . Hypertension   . Hyponatremia 07/04/2017  . Migraine with prolonged aura, not intractable 07/04/2017  . Osteoporosis 07/04/2017  . Palpitations 07/04/2017    Past Surgical History:  Procedure Laterality Date  . ABDOMINAL HYSTERECTOMY    . CYSTOCELE REPAIR    . TONSILLECTOMY      Current Medications: Current Meds  Medication Sig  . aspirin EC 81 MG tablet Take 81 mg by mouth daily.   Marland Kitchen conjugated estrogens (PREMARIN) vaginal cream Place 1 Applicatorful vaginally 2 (two) times a week.   . denosumab (PROLIA) 60 MG/ML SOLN injection Inject 60 mg into the skin every 6 (six) months. Administer in upper arm, thigh, or abdomen  . lisinopril (PRINIVIL,ZESTRIL) 20 MG tablet Take 20 mg by mouth daily.   Marland Kitchen loratadine (CLARITIN) 10 MG tablet Take by mouth.  . nitroGLYCERIN (NITROSTAT) 0.4 MG SL tablet Place 1 tablet (0.4 mg total) under the tongue every 5 (five) minutes as needed for chest pain. If pain not relieved by 3rd dose, call 911.     Allergies:   Ibuprofen and Azithromycin   Social History   Socioeconomic History  . Marital status: Widowed    Spouse name: Not on file  . Number of children: Not on file  . Years of education: Not on file  . Highest education level: Not on file   Occupational History  . Not on file  Tobacco Use  . Smoking status: Never Smoker  . Smokeless tobacco: Never Used  Substance and Sexual Activity  . Alcohol use: Not Currently  . Drug use: Not Currently  . Sexual activity: Not on file  Other Topics Concern  . Not on file  Social History Narrative  . Not on file   Social Determinants of Health   Financial Resource Strain:   . Difficulty of Paying Living Expenses:   Food Insecurity:   . Worried About Charity fundraiser in the Last Year:   . Arboriculturist in the Last Year:   Transportation Needs:   . Film/video editor (Medical):   Marland Kitchen Lack of Transportation (Non-Medical):   Physical Activity:   . Days of Exercise per Week:   . Minutes of Exercise per Session:   Stress:   . Feeling of Stress :   Social Connections:   . Frequency of Communication with Friends and Family:   . Frequency of Social Gatherings with Friends and Family:   . Attends Religious Services:   . Active Member of Clubs or Organizations:   . Attends Archivist Meetings:   .  Marital Status:      Family History: The patient's family history includes Bone cancer in her maternal grandfather; Cerebrovascular Disease in her father; Diabetes in her brother and sister; Heart attack in her brother; Heart disease in her brother and father; Hyperlipidemia in her father, paternal grandfather, and paternal grandmother; Hypertension in her father; Ovarian cancer in her mother; Pancreatic cancer in her sister; Stroke in her brother and father. ROS:   Please see the history of present illness.    All other systems reviewed and are negative.  EKGs/Labs/Other Studies Reviewed:    The following studies were reviewed today:    Recent Labs: 02/23/2019: BUN 26; Creatinine, Ser 1.09; Hemoglobin 14.8; Platelets 244; Potassium 4.0; Sodium 124  Recent Lipid Panel 02/15/2019 cholesterol 173 HDL 51 LDL 98, these are favorable results  Physical Exam:    VS:  BP  108/60 (BP Location: Left Arm, Patient Position: Sitting, Cuff Size: Normal)   Pulse 72   Temp (!) 97.2 F (36.2 C)   Ht 5\' 6"  (1.676 m)   Wt 135 lb 9.6 oz (61.5 kg)   SpO2 97%   BMI 21.89 kg/m     Wt Readings from Last 3 Encounters:  04/13/19 135 lb 9.6 oz (61.5 kg)  03/08/19 135 lb (61.2 kg)  02/23/19 135 lb 12.8 oz (61.6 kg)     GEN:  Well nourished, well developed in no acute distress HEENT: Normal NECK: No JVD; No carotid bruits LYMPHATICS: No lymphadenopathy CARDIAC: Grade 1/6 to 2/6 MR at the apex no radiation RRR, no  rubs, gallops RESPIRATORY:  Clear to auscultation without rales, wheezing or rhonchi  ABDOMEN: Soft, non-tender, non-distended MUSCULOSKELETAL:  No edema; No deformity  SKIN: Warm and dry NEUROLOGIC:  Alert and oriented x 3 PSYCHIATRIC:  Normal affect    Signed, 04/23/19, MD  04/13/2019 2:27 PM    [facility]

## 2019-04-13 ENCOUNTER — Other Ambulatory Visit: Payer: Self-pay

## 2019-04-13 ENCOUNTER — Ambulatory Visit: Payer: Medicare PPO | Admitting: Cardiology

## 2019-04-13 ENCOUNTER — Encounter: Payer: Self-pay | Admitting: Cardiology

## 2019-04-13 VITALS — BP 108/60 | HR 72 | Temp 97.2°F | Ht 66.0 in | Wt 135.6 lb

## 2019-04-13 DIAGNOSIS — H6123 Impacted cerumen, bilateral: Secondary | ICD-10-CM | POA: Diagnosis not present

## 2019-04-13 DIAGNOSIS — R079 Chest pain, unspecified: Secondary | ICD-10-CM

## 2019-04-13 DIAGNOSIS — K219 Gastro-esophageal reflux disease without esophagitis: Secondary | ICD-10-CM | POA: Diagnosis not present

## 2019-04-13 DIAGNOSIS — M545 Low back pain: Secondary | ICD-10-CM | POA: Diagnosis not present

## 2019-04-13 DIAGNOSIS — I493 Ventricular premature depolarization: Secondary | ICD-10-CM

## 2019-04-13 DIAGNOSIS — I340 Nonrheumatic mitral (valve) insufficiency: Secondary | ICD-10-CM

## 2019-04-13 DIAGNOSIS — I10 Essential (primary) hypertension: Secondary | ICD-10-CM | POA: Diagnosis not present

## 2019-04-13 DIAGNOSIS — G43109 Migraine with aura, not intractable, without status migrainosus: Secondary | ICD-10-CM | POA: Diagnosis not present

## 2019-04-13 DIAGNOSIS — E871 Hypo-osmolality and hyponatremia: Secondary | ICD-10-CM | POA: Diagnosis not present

## 2019-04-13 DIAGNOSIS — Z6822 Body mass index (BMI) 22.0-22.9, adult: Secondary | ICD-10-CM | POA: Diagnosis not present

## 2019-04-13 DIAGNOSIS — J309 Allergic rhinitis, unspecified: Secondary | ICD-10-CM | POA: Diagnosis not present

## 2019-04-13 DIAGNOSIS — I679 Cerebrovascular disease, unspecified: Secondary | ICD-10-CM | POA: Diagnosis not present

## 2019-04-13 NOTE — Patient Instructions (Signed)

## 2019-04-26 DIAGNOSIS — Z1231 Encounter for screening mammogram for malignant neoplasm of breast: Secondary | ICD-10-CM | POA: Diagnosis not present

## 2019-05-18 DIAGNOSIS — J309 Allergic rhinitis, unspecified: Secondary | ICD-10-CM | POA: Diagnosis not present

## 2019-05-18 DIAGNOSIS — M810 Age-related osteoporosis without current pathological fracture: Secondary | ICD-10-CM | POA: Diagnosis not present

## 2019-05-18 DIAGNOSIS — G43109 Migraine with aura, not intractable, without status migrainosus: Secondary | ICD-10-CM | POA: Diagnosis not present

## 2019-05-18 DIAGNOSIS — M545 Low back pain: Secondary | ICD-10-CM | POA: Diagnosis not present

## 2019-05-18 DIAGNOSIS — N959 Unspecified menopausal and perimenopausal disorder: Secondary | ICD-10-CM | POA: Diagnosis not present

## 2019-05-18 DIAGNOSIS — E7800 Pure hypercholesterolemia, unspecified: Secondary | ICD-10-CM | POA: Diagnosis not present

## 2019-05-18 DIAGNOSIS — E871 Hypo-osmolality and hyponatremia: Secondary | ICD-10-CM | POA: Diagnosis not present

## 2019-05-18 DIAGNOSIS — I10 Essential (primary) hypertension: Secondary | ICD-10-CM | POA: Diagnosis not present

## 2019-05-18 DIAGNOSIS — I493 Ventricular premature depolarization: Secondary | ICD-10-CM | POA: Diagnosis not present

## 2019-05-18 DIAGNOSIS — I679 Cerebrovascular disease, unspecified: Secondary | ICD-10-CM | POA: Diagnosis not present

## 2019-05-18 DIAGNOSIS — K219 Gastro-esophageal reflux disease without esophagitis: Secondary | ICD-10-CM | POA: Diagnosis not present

## 2019-05-26 DIAGNOSIS — N959 Unspecified menopausal and perimenopausal disorder: Secondary | ICD-10-CM | POA: Diagnosis not present

## 2019-05-26 DIAGNOSIS — G43109 Migraine with aura, not intractable, without status migrainosus: Secondary | ICD-10-CM | POA: Diagnosis not present

## 2019-05-26 DIAGNOSIS — K219 Gastro-esophageal reflux disease without esophagitis: Secondary | ICD-10-CM | POA: Diagnosis not present

## 2019-05-26 DIAGNOSIS — M810 Age-related osteoporosis without current pathological fracture: Secondary | ICD-10-CM | POA: Diagnosis not present

## 2019-05-26 DIAGNOSIS — E871 Hypo-osmolality and hyponatremia: Secondary | ICD-10-CM | POA: Diagnosis not present

## 2019-05-26 DIAGNOSIS — J309 Allergic rhinitis, unspecified: Secondary | ICD-10-CM | POA: Diagnosis not present

## 2019-05-26 DIAGNOSIS — I679 Cerebrovascular disease, unspecified: Secondary | ICD-10-CM | POA: Diagnosis not present

## 2019-05-26 DIAGNOSIS — I493 Ventricular premature depolarization: Secondary | ICD-10-CM | POA: Diagnosis not present

## 2019-05-26 DIAGNOSIS — M545 Low back pain: Secondary | ICD-10-CM | POA: Diagnosis not present

## 2019-07-06 DIAGNOSIS — M810 Age-related osteoporosis without current pathological fracture: Secondary | ICD-10-CM | POA: Diagnosis not present

## 2019-07-21 DIAGNOSIS — D1801 Hemangioma of skin and subcutaneous tissue: Secondary | ICD-10-CM | POA: Diagnosis not present

## 2019-07-21 DIAGNOSIS — L578 Other skin changes due to chronic exposure to nonionizing radiation: Secondary | ICD-10-CM | POA: Diagnosis not present

## 2019-07-21 DIAGNOSIS — D225 Melanocytic nevi of trunk: Secondary | ICD-10-CM | POA: Diagnosis not present

## 2019-07-21 DIAGNOSIS — D2239 Melanocytic nevi of other parts of face: Secondary | ICD-10-CM | POA: Diagnosis not present

## 2019-08-17 DIAGNOSIS — E7800 Pure hypercholesterolemia, unspecified: Secondary | ICD-10-CM | POA: Diagnosis not present

## 2019-08-17 DIAGNOSIS — G43109 Migraine with aura, not intractable, without status migrainosus: Secondary | ICD-10-CM | POA: Diagnosis not present

## 2019-08-17 DIAGNOSIS — M545 Low back pain: Secondary | ICD-10-CM | POA: Diagnosis not present

## 2019-08-17 DIAGNOSIS — I679 Cerebrovascular disease, unspecified: Secondary | ICD-10-CM | POA: Diagnosis not present

## 2019-08-17 DIAGNOSIS — I10 Essential (primary) hypertension: Secondary | ICD-10-CM | POA: Diagnosis not present

## 2019-08-17 DIAGNOSIS — N959 Unspecified menopausal and perimenopausal disorder: Secondary | ICD-10-CM | POA: Diagnosis not present

## 2019-08-24 DIAGNOSIS — E785 Hyperlipidemia, unspecified: Secondary | ICD-10-CM | POA: Diagnosis not present

## 2019-08-24 DIAGNOSIS — Z0000 Encounter for general adult medical examination without abnormal findings: Secondary | ICD-10-CM | POA: Diagnosis not present

## 2019-08-24 DIAGNOSIS — Z9181 History of falling: Secondary | ICD-10-CM | POA: Diagnosis not present

## 2019-08-24 DIAGNOSIS — Z1331 Encounter for screening for depression: Secondary | ICD-10-CM | POA: Diagnosis not present

## 2019-08-27 DIAGNOSIS — M85852 Other specified disorders of bone density and structure, left thigh: Secondary | ICD-10-CM | POA: Diagnosis not present

## 2019-08-27 DIAGNOSIS — N959 Unspecified menopausal and perimenopausal disorder: Secondary | ICD-10-CM | POA: Diagnosis not present

## 2019-08-27 DIAGNOSIS — M85832 Other specified disorders of bone density and structure, left forearm: Secondary | ICD-10-CM | POA: Diagnosis not present

## 2019-08-31 DIAGNOSIS — I10 Essential (primary) hypertension: Secondary | ICD-10-CM | POA: Diagnosis not present

## 2019-08-31 DIAGNOSIS — I679 Cerebrovascular disease, unspecified: Secondary | ICD-10-CM | POA: Diagnosis not present

## 2019-09-14 DIAGNOSIS — J0101 Acute recurrent maxillary sinusitis: Secondary | ICD-10-CM | POA: Diagnosis not present

## 2019-09-14 DIAGNOSIS — I10 Essential (primary) hypertension: Secondary | ICD-10-CM | POA: Diagnosis not present

## 2019-09-14 DIAGNOSIS — G43109 Migraine with aura, not intractable, without status migrainosus: Secondary | ICD-10-CM | POA: Diagnosis not present

## 2019-09-14 DIAGNOSIS — N959 Unspecified menopausal and perimenopausal disorder: Secondary | ICD-10-CM | POA: Diagnosis not present

## 2019-09-14 DIAGNOSIS — E871 Hypo-osmolality and hyponatremia: Secondary | ICD-10-CM | POA: Diagnosis not present

## 2019-09-14 DIAGNOSIS — I679 Cerebrovascular disease, unspecified: Secondary | ICD-10-CM | POA: Diagnosis not present

## 2019-09-14 DIAGNOSIS — K219 Gastro-esophageal reflux disease without esophagitis: Secondary | ICD-10-CM | POA: Diagnosis not present

## 2019-09-14 DIAGNOSIS — M545 Low back pain: Secondary | ICD-10-CM | POA: Diagnosis not present

## 2019-09-14 DIAGNOSIS — E7800 Pure hypercholesterolemia, unspecified: Secondary | ICD-10-CM | POA: Diagnosis not present

## 2019-10-11 NOTE — Progress Notes (Signed)
Cardiology Office Note:    Date:  10/12/2019   ID:  Olivia Blackburn, DOB 03-Apr-1945, MRN [id number]  PCP:  Simone Curia, MD  Cardiologist:  Norman Herrlich, MD    Referring MD: Simone Curia, MD    ASSESSMENT:    1. Non-rheumatic mitral regurgitation   2. Essential hypertension   3. Frequent PVCs   4. APC (atrial premature contractions)   5. Mixed hyperlipidemia    PLAN:    In order of problems listed above:  1. Stable clinically she does not have severe mitral regurgitation and on think she needs a repeat echocardiogram this visit I will plan to see back in the office in 1 year we will do it at that time.  If she is noticing shortness of breath or exercise intolerance she will contact me. 2. Hypertension is well controlled continue current treatment beta-blocker with her atrial and ventricular arrhythmia and ACE inhibitor 3. Arrhythmias improved asymptomatic 4. Continue her investigational lipid-lowering medication, she asked me about other choices and I told her that if she is unable to tolerate it we could consider bempedoic acid Zetia her resin binder.  1 year follow-up      Medication Adjustments/Labs and Tests Ordered: Current medicines are reviewed at length with the patient today.  Concerns regarding medicines are outlined above.  No orders of the defined types were placed in this encounter.  No orders of the defined types were placed in this encounter.  Chief complaint follow-up for atrial and ventricular arrhythmia and mitral regurgitation  History of Present Illness:    Olivia Blackburn is a 74 y.o. female with a hx of frequent APCs and PVCs hypertension hyperlipidemia history of previous stroke and previous echocardiogram with moderate mitral regurgitation last seen 04/13/2019. She had a myocardial perfusion study 03/08/2019 which was normal ejection fraction 62% the left ventricle was not dilated. Last echocardiogram done 03/08/2019 shows normal left ventricular volume EF  55% normal right ventricular function mild to moderate left atrial enlargement moderate central mitral regurgitation Compliance with diet, lifestyle and medications: Yes  In general she is doing better she had a period of time her blood pressure was running greater than 140 systolic she thought it was a research lipid-lowering medication stopped it blood pressure improved back on half dose and her systolics run 100 115/60.  She feels well she is not having chest pain palpitation shortness of breath.  Recent labs 08/17/2019 cholesterol 191 LDL 119 triglycerides 105 HDL 35 creatinine 1.07 TSH normal 1.28.  I did not repeat an EKG Past Medical History:  Diagnosis Date  . Allergic rhinitis 07/04/2017  . APC (atrial premature contractions) 07/07/2017  . Cerebrovascular accident (CVA) (HCC) 07/04/2017  . Closed fracture of base of fifth metatarsal bone 05/16/2017  . Frequent PVCs 08/13/2017  . GERD (gastroesophageal reflux disease) 07/04/2017  . Hyperlipidemia 07/04/2017  . Hypertension   . Hyponatremia 07/04/2017  . Migraine with prolonged aura, not intractable 07/04/2017  . Mitral regurgitation 08/13/2017  . Osteoporosis 07/04/2017  . Palpitations 07/04/2017    Past Surgical History:  Procedure Laterality Date  . ABDOMINAL HYSTERECTOMY    . CYSTOCELE REPAIR    . TONSILLECTOMY      Current Medications: Current Meds  Medication Sig  . aspirin EC 81 MG tablet Take 81 mg by mouth daily.   . Calcium-Vitamin D-Vitamin K (CALCIUM SOFT CHEWS) 500-500-40 MG-UNT-MCG CHEW Chew 1 tablet by mouth 2 (two) times daily.   Marland Kitchen conjugated estrogens (PREMARIN) vaginal cream Place  1 Applicatorful vaginally 2 (two) times a week.   . denosumab (PROLIA) 60 MG/ML SOLN injection Inject 60 mg into the skin every 6 (six) months. Administer in upper arm, thigh, or abdomen  . lisinopril (PRINIVIL,ZESTRIL) 20 MG tablet Take 20 mg by mouth daily.   Marland Kitchen loratadine (CLARITIN) 10 MG tablet Take by mouth.  . nitroGLYCERIN (NITROSTAT)  0.4 MG SL tablet Place 1 tablet (0.4 mg total) under the tongue every 5 (five) minutes as needed for chest pain. If pain not relieved by 3rd dose, call 911.     Allergies:   Ibuprofen and Azithromycin   Social History   Socioeconomic History  . Marital status: Widowed    Spouse name: Not on file  . Number of children: Not on file  . Years of education: Not on file  . Highest education level: Not on file  Occupational History  . Not on file  Tobacco Use  . Smoking status: Never Smoker  . Smokeless tobacco: Never Used  Vaping Use  . Vaping Use: Never used  Substance and Sexual Activity  . Alcohol use: Not Currently  . Drug use: Not Currently  . Sexual activity: Not on file  Other Topics Concern  . Not on file  Social History Narrative  . Not on file   Social Determinants of Health   Financial Resource Strain:   . Difficulty of Paying Living Expenses: Not on file  Food Insecurity:   . Worried About Programme researcher, broadcasting/film/video in the Last Year: Not on file  . Ran Out of Food in the Last Year: Not on file  Transportation Needs:   . Lack of Transportation (Medical): Not on file  . Lack of Transportation (Non-Medical): Not on file  Physical Activity:   . Days of Exercise per Week: Not on file  . Minutes of Exercise per Session: Not on file  Stress:   . Feeling of Stress : Not on file  Social Connections:   . Frequency of Communication with Friends and Family: Not on file  . Frequency of Social Gatherings with Friends and Family: Not on file  . Attends Religious Services: Not on file  . Active Member of Clubs or Organizations: Not on file  . Attends Banker Meetings: Not on file  . Marital Status: Not on file     Family History: The patient's family history includes Bone cancer in her maternal grandfather; Cerebrovascular Disease in her father; Diabetes in her brother and sister; Heart attack in her brother; Heart disease in her brother and father; Hyperlipidemia in  her father, paternal grandfather, and paternal grandmother; Hypertension in her father; Ovarian cancer in her mother; Pancreatic cancer in her sister; Stroke in her brother and father. ROS:   Please see the history of present illness.    All other systems reviewed and are negative.  EKGs/Labs/Other Studies Reviewed:    The following studies were reviewed today:    Physical Exam:    VS:  BP (!) 102/50   Pulse (!) 58   Ht 5\' 6"  (1.676 m)   Wt 134 lb 3.2 oz (60.9 kg)   SpO2 98%   BMI 21.66 kg/m     Wt Readings from Last 3 Encounters:  10/12/19 134 lb 3.2 oz (60.9 kg)  04/13/19 135 lb 9.6 oz (61.5 kg)  03/08/19 135 lb (61.2 kg)     GEN: She appears her age looks healthy well nourished, well developed in no acute distress HEENT: Normal NECK:  No JVD; No carotid bruits LYMPHATICS: No lymphadenopathy CARDIAC: No irregularity RRR, no murmurs, rubs, gallops RESPIRATORY:  Clear to auscultation without rales, wheezing or rhonchi  ABDOMEN: Soft, non-tender, non-distended MUSCULOSKELETAL:  No edema; No deformity  SKIN: Warm and dry NEUROLOGIC:  Alert and oriented x 3 PSYCHIATRIC:  Normal affect    Signed, Norman Herrlich, MD  10/12/2019 8:37 AM    [facility]

## 2019-10-12 ENCOUNTER — Encounter: Payer: Self-pay | Admitting: Cardiology

## 2019-10-12 ENCOUNTER — Other Ambulatory Visit: Payer: Self-pay

## 2019-10-12 ENCOUNTER — Ambulatory Visit: Payer: Medicare PPO | Admitting: Cardiology

## 2019-10-12 VITALS — BP 102/50 | HR 58 | Ht 66.0 in | Wt 134.2 lb

## 2019-10-12 DIAGNOSIS — Z881 Allergy status to other antibiotic agents status: Secondary | ICD-10-CM | POA: Diagnosis not present

## 2019-10-12 DIAGNOSIS — E782 Mixed hyperlipidemia: Secondary | ICD-10-CM | POA: Diagnosis not present

## 2019-10-12 DIAGNOSIS — J309 Allergic rhinitis, unspecified: Secondary | ICD-10-CM | POA: Diagnosis not present

## 2019-10-12 DIAGNOSIS — I340 Nonrheumatic mitral (valve) insufficiency: Secondary | ICD-10-CM

## 2019-10-12 DIAGNOSIS — K219 Gastro-esophageal reflux disease without esophagitis: Secondary | ICD-10-CM | POA: Diagnosis not present

## 2019-10-12 DIAGNOSIS — E785 Hyperlipidemia, unspecified: Secondary | ICD-10-CM | POA: Diagnosis not present

## 2019-10-12 DIAGNOSIS — I10 Essential (primary) hypertension: Secondary | ICD-10-CM

## 2019-10-12 DIAGNOSIS — M4800 Spinal stenosis, site unspecified: Secondary | ICD-10-CM | POA: Diagnosis not present

## 2019-10-12 DIAGNOSIS — Z8249 Family history of ischemic heart disease and other diseases of the circulatory system: Secondary | ICD-10-CM | POA: Diagnosis not present

## 2019-10-12 DIAGNOSIS — M810 Age-related osteoporosis without current pathological fracture: Secondary | ICD-10-CM | POA: Diagnosis not present

## 2019-10-12 DIAGNOSIS — I491 Atrial premature depolarization: Secondary | ICD-10-CM

## 2019-10-12 DIAGNOSIS — G8929 Other chronic pain: Secondary | ICD-10-CM | POA: Diagnosis not present

## 2019-10-12 DIAGNOSIS — Z7982 Long term (current) use of aspirin: Secondary | ICD-10-CM | POA: Diagnosis not present

## 2019-10-12 DIAGNOSIS — I493 Ventricular premature depolarization: Secondary | ICD-10-CM

## 2019-10-12 DIAGNOSIS — N952 Postmenopausal atrophic vaginitis: Secondary | ICD-10-CM | POA: Diagnosis not present

## 2019-10-12 NOTE — Patient Instructions (Signed)
Medication Instructions:  Your physician recommends that you continue on your current medications as directed. Please refer to the Current Medication list given to you today.  *If you need a refill on your cardiac medications before your next appointment, please call your pharmacy*   Lab Work: None ordered   If you have labs (blood work) drawn today and your tests are completely normal, you will receive your results only by: . MyChart Message (if you have MyChart) OR . A paper copy in the mail If you have any lab test that is abnormal or we need to change your treatment, we will call you to review the results.   Testing/Procedures: None ordered    Follow-Up: At [facility], you and your health needs are our priority.  As part of our continuing mission to provide you with exceptional heart care, we have created designated Provider Care Teams.  These Care Teams include your primary Cardiologist (physician) and Advanced Practice Providers (APPs -  Physician Assistants and Nurse Practitioners) who all work together to provide you with the care you need, when you need it.  We recommend signing up for the patient portal called "MyChart".  Sign up information is provided on this After Visit Summary.  MyChart is used to connect with patients for Virtual Visits (Telemedicine).  Patients are able to view lab/test results, encounter notes, upcoming appointments, etc.  Non-urgent messages can be sent to your provider as well.   To learn more about what you can do with MyChart, go to [email or web address].    Your next appointment:   12 month(s)  The format for your next appointment:   In Person  Provider:   Brian Munley, MD   Other Instructions None   

## 2019-11-10 DIAGNOSIS — I679 Cerebrovascular disease, unspecified: Secondary | ICD-10-CM | POA: Diagnosis not present

## 2019-11-10 DIAGNOSIS — N959 Unspecified menopausal and perimenopausal disorder: Secondary | ICD-10-CM | POA: Diagnosis not present

## 2019-11-10 DIAGNOSIS — I10 Essential (primary) hypertension: Secondary | ICD-10-CM | POA: Diagnosis not present

## 2019-11-10 DIAGNOSIS — G43109 Migraine with aura, not intractable, without status migrainosus: Secondary | ICD-10-CM | POA: Diagnosis not present

## 2019-11-17 DIAGNOSIS — I679 Cerebrovascular disease, unspecified: Secondary | ICD-10-CM | POA: Diagnosis not present

## 2019-11-17 DIAGNOSIS — G43109 Migraine with aura, not intractable, without status migrainosus: Secondary | ICD-10-CM | POA: Diagnosis not present

## 2019-11-17 DIAGNOSIS — I10 Essential (primary) hypertension: Secondary | ICD-10-CM | POA: Diagnosis not present

## 2019-11-17 DIAGNOSIS — N959 Unspecified menopausal and perimenopausal disorder: Secondary | ICD-10-CM | POA: Diagnosis not present

## 2019-11-17 DIAGNOSIS — E7800 Pure hypercholesterolemia, unspecified: Secondary | ICD-10-CM | POA: Diagnosis not present

## 2019-11-18 DIAGNOSIS — I679 Cerebrovascular disease, unspecified: Secondary | ICD-10-CM | POA: Diagnosis not present

## 2019-11-18 DIAGNOSIS — N959 Unspecified menopausal and perimenopausal disorder: Secondary | ICD-10-CM | POA: Diagnosis not present

## 2019-11-18 DIAGNOSIS — M545 Low back pain, unspecified: Secondary | ICD-10-CM | POA: Diagnosis not present

## 2019-11-18 DIAGNOSIS — M25552 Pain in left hip: Secondary | ICD-10-CM | POA: Diagnosis not present

## 2019-11-18 DIAGNOSIS — G43109 Migraine with aura, not intractable, without status migrainosus: Secondary | ICD-10-CM | POA: Diagnosis not present

## 2019-11-25 DIAGNOSIS — M25552 Pain in left hip: Secondary | ICD-10-CM | POA: Diagnosis not present

## 2019-11-25 DIAGNOSIS — G43109 Migraine with aura, not intractable, without status migrainosus: Secondary | ICD-10-CM | POA: Diagnosis not present

## 2019-11-25 DIAGNOSIS — I679 Cerebrovascular disease, unspecified: Secondary | ICD-10-CM | POA: Diagnosis not present

## 2019-11-25 DIAGNOSIS — N959 Unspecified menopausal and perimenopausal disorder: Secondary | ICD-10-CM | POA: Diagnosis not present

## 2019-12-09 DIAGNOSIS — M25552 Pain in left hip: Secondary | ICD-10-CM | POA: Diagnosis not present

## 2019-12-09 DIAGNOSIS — G43109 Migraine with aura, not intractable, without status migrainosus: Secondary | ICD-10-CM | POA: Diagnosis not present

## 2019-12-09 DIAGNOSIS — I679 Cerebrovascular disease, unspecified: Secondary | ICD-10-CM | POA: Diagnosis not present

## 2019-12-09 DIAGNOSIS — Z139 Encounter for screening, unspecified: Secondary | ICD-10-CM | POA: Diagnosis not present

## 2019-12-09 DIAGNOSIS — N959 Unspecified menopausal and perimenopausal disorder: Secondary | ICD-10-CM | POA: Diagnosis not present

## 2019-12-13 DIAGNOSIS — H2513 Age-related nuclear cataract, bilateral: Secondary | ICD-10-CM | POA: Diagnosis not present

## 2020-01-06 DIAGNOSIS — M810 Age-related osteoporosis without current pathological fracture: Secondary | ICD-10-CM | POA: Diagnosis not present

## 2020-02-10 DIAGNOSIS — N959 Unspecified menopausal and perimenopausal disorder: Secondary | ICD-10-CM | POA: Diagnosis not present

## 2020-02-10 DIAGNOSIS — M25552 Pain in left hip: Secondary | ICD-10-CM | POA: Diagnosis not present

## 2020-02-10 DIAGNOSIS — I493 Ventricular premature depolarization: Secondary | ICD-10-CM | POA: Diagnosis not present

## 2020-02-10 DIAGNOSIS — M545 Low back pain, unspecified: Secondary | ICD-10-CM | POA: Diagnosis not present

## 2020-02-10 DIAGNOSIS — I679 Cerebrovascular disease, unspecified: Secondary | ICD-10-CM | POA: Diagnosis not present

## 2020-02-10 DIAGNOSIS — G43109 Migraine with aura, not intractable, without status migrainosus: Secondary | ICD-10-CM | POA: Diagnosis not present

## 2020-02-10 DIAGNOSIS — K219 Gastro-esophageal reflux disease without esophagitis: Secondary | ICD-10-CM | POA: Diagnosis not present

## 2020-02-10 DIAGNOSIS — M810 Age-related osteoporosis without current pathological fracture: Secondary | ICD-10-CM | POA: Diagnosis not present

## 2020-02-10 DIAGNOSIS — E871 Hypo-osmolality and hyponatremia: Secondary | ICD-10-CM | POA: Diagnosis not present

## 2020-02-17 DIAGNOSIS — M810 Age-related osteoporosis without current pathological fracture: Secondary | ICD-10-CM | POA: Diagnosis not present

## 2020-02-17 DIAGNOSIS — I10 Essential (primary) hypertension: Secondary | ICD-10-CM | POA: Diagnosis not present

## 2020-02-17 DIAGNOSIS — G43109 Migraine with aura, not intractable, without status migrainosus: Secondary | ICD-10-CM | POA: Diagnosis not present

## 2020-02-17 DIAGNOSIS — I493 Ventricular premature depolarization: Secondary | ICD-10-CM | POA: Diagnosis not present

## 2020-02-17 DIAGNOSIS — E871 Hypo-osmolality and hyponatremia: Secondary | ICD-10-CM | POA: Diagnosis not present

## 2020-02-17 DIAGNOSIS — M545 Low back pain, unspecified: Secondary | ICD-10-CM | POA: Diagnosis not present

## 2020-02-17 DIAGNOSIS — M25552 Pain in left hip: Secondary | ICD-10-CM | POA: Diagnosis not present

## 2020-02-17 DIAGNOSIS — N959 Unspecified menopausal and perimenopausal disorder: Secondary | ICD-10-CM | POA: Diagnosis not present

## 2020-02-17 DIAGNOSIS — I679 Cerebrovascular disease, unspecified: Secondary | ICD-10-CM | POA: Diagnosis not present

## 2020-02-17 DIAGNOSIS — K219 Gastro-esophageal reflux disease without esophagitis: Secondary | ICD-10-CM | POA: Diagnosis not present

## 2020-03-08 DIAGNOSIS — N952 Postmenopausal atrophic vaginitis: Secondary | ICD-10-CM | POA: Diagnosis not present

## 2020-03-08 DIAGNOSIS — N3281 Overactive bladder: Secondary | ICD-10-CM | POA: Diagnosis not present

## 2020-04-20 DIAGNOSIS — I493 Ventricular premature depolarization: Secondary | ICD-10-CM | POA: Diagnosis not present

## 2020-04-20 DIAGNOSIS — I679 Cerebrovascular disease, unspecified: Secondary | ICD-10-CM | POA: Diagnosis not present

## 2020-04-20 DIAGNOSIS — M810 Age-related osteoporosis without current pathological fracture: Secondary | ICD-10-CM | POA: Diagnosis not present

## 2020-04-20 DIAGNOSIS — K219 Gastro-esophageal reflux disease without esophagitis: Secondary | ICD-10-CM | POA: Diagnosis not present

## 2020-04-20 DIAGNOSIS — N390 Urinary tract infection, site not specified: Secondary | ICD-10-CM | POA: Diagnosis not present

## 2020-04-20 DIAGNOSIS — M545 Low back pain, unspecified: Secondary | ICD-10-CM | POA: Diagnosis not present

## 2020-04-20 DIAGNOSIS — G43109 Migraine with aura, not intractable, without status migrainosus: Secondary | ICD-10-CM | POA: Diagnosis not present

## 2020-04-20 DIAGNOSIS — E871 Hypo-osmolality and hyponatremia: Secondary | ICD-10-CM | POA: Diagnosis not present

## 2020-04-20 DIAGNOSIS — N959 Unspecified menopausal and perimenopausal disorder: Secondary | ICD-10-CM | POA: Diagnosis not present

## 2020-05-01 DIAGNOSIS — G43109 Migraine with aura, not intractable, without status migrainosus: Secondary | ICD-10-CM | POA: Diagnosis not present

## 2020-05-01 DIAGNOSIS — N959 Unspecified menopausal and perimenopausal disorder: Secondary | ICD-10-CM | POA: Diagnosis not present

## 2020-05-01 DIAGNOSIS — K219 Gastro-esophageal reflux disease without esophagitis: Secondary | ICD-10-CM | POA: Diagnosis not present

## 2020-05-01 DIAGNOSIS — R001 Bradycardia, unspecified: Secondary | ICD-10-CM | POA: Diagnosis not present

## 2020-05-01 DIAGNOSIS — M810 Age-related osteoporosis without current pathological fracture: Secondary | ICD-10-CM | POA: Diagnosis not present

## 2020-05-01 DIAGNOSIS — I679 Cerebrovascular disease, unspecified: Secondary | ICD-10-CM | POA: Diagnosis not present

## 2020-05-01 DIAGNOSIS — R5382 Chronic fatigue, unspecified: Secondary | ICD-10-CM | POA: Diagnosis not present

## 2020-05-01 DIAGNOSIS — E871 Hypo-osmolality and hyponatremia: Secondary | ICD-10-CM | POA: Diagnosis not present

## 2020-05-01 DIAGNOSIS — M545 Low back pain, unspecified: Secondary | ICD-10-CM | POA: Diagnosis not present

## 2020-05-04 NOTE — Progress Notes (Signed)
Cardiology Office Note:    Date:  05/05/2020   ID:  Olivia Blackburn, DOB Jul 16, 1945, MRN [id number]  PCP:  Simone Curia, MD  Cardiologist:  Norman Herrlich, MD    Referring MD: Simone Curia, MD    ASSESSMENT:    1. Bradycardia with 31-40 beats per minute   2. Premature ventricular contractions (PVCs) (VPCs)   3. Non-rheumatic mitral regurgitation   4. Essential hypertension   5. Primary hypertension   6. Mixed hyperlipidemia    PLAN:    In order of problems listed above:  1 episode of symptomatic bradycardia is concerning this may be miscounting with atrial bigeminy however she is also at risk of atrial fibrillation we will plan a 14-day live monitor to assess her heart rhythm.  If atrial fibrillation is documented she would require anticoagulation.  If severe bradycardia or heart block is noted pacemaker is appropriate Recheck echocardiogram for mitral regurgitation BP at target presently not taking ACE inhibitor Presently not on lipid-lowering therapy  Next appointment: 6 to 8 weeks   Medication Adjustments/Labs and Tests Ordered: Current medicines are reviewed at length with the patient today.  Concerns regarding medicines are outlined above.  Orders Placed This Encounter  Procedures  . EKG 12-Lead   No orders of the defined types were placed in this encounter.   Chief Complaint  Patient presents with  . Bradycardia    History of Present Illness:    Olivia Blackburn is a 75 y.o. female with a hx of  frequent APCs and PVCs hypertension hyperlipidemia history of previous stroke and previous echocardiogram with moderate mitral regurgitation last seen 04/13/2019. She had a myocardial perfusion study 03/08/2019 which was normal ejection fraction 62% the left ventricle was not dilated. Last echocardiogram done 03/08/2019 shows normal left ventricular volume EF 55% normal right ventricular function mild to moderate left atrial enlargement moderate central mitral regurgitation  last  seen 10/12/2019.  Compliance with diet, lifestyle and medications: Yes  Last week was not feeling well she had episodes of marked weakness even some left-sided weakness heart rates in the high 30s at home followed up with her PCP she was noted to have an abnormal EKG and advised to see cardiology.  Her EKG in the office showed sinus arrhythmia bradycardia with PVCs labs showed a hemoglobin of 13.8 creatinine 0.9 potassium 4.3 TSH minimally elevated 4.74.  Since then she has not had another episode.  No chest pain shortness of breath edema or syncope. Past Medical History:  Diagnosis Date  . Allergic rhinitis 07/04/2017  . APC (atrial premature contractions) 07/07/2017  . Cerebrovascular accident (CVA) (HCC) 07/04/2017  . Closed fracture of base of fifth metatarsal bone 05/16/2017  . Frequent PVCs 08/13/2017  . GERD (gastroesophageal reflux disease) 07/04/2017  . Hyperlipidemia 07/04/2017  . Hypertension   . Hyponatremia 07/04/2017  . Migraine with prolonged aura, not intractable 07/04/2017  . Mitral regurgitation 08/13/2017  . Osteoporosis 07/04/2017  . Palpitations 07/04/2017    Past Surgical History:  Procedure Laterality Date  . ABDOMINAL HYSTERECTOMY    . CYSTOCELE REPAIR    . TONSILLECTOMY      Current Medications: Current Meds  Medication Sig  . aspirin EC 81 MG tablet Take 81 mg by mouth daily.   . Calcium-Vitamin D-Vitamin K 500-500-40 MG-UNT-MCG CHEW Chew 1 tablet by mouth 2 (two) times daily.   Marland Kitchen conjugated estrogens (PREMARIN) vaginal cream Place 1 Applicatorful vaginally 2 (two) times a week.   . denosumab (PROLIA) 60 MG/ML  SOLN injection Inject 60 mg into the skin every 6 (six) months. Administer in upper arm, thigh, or abdomen  . loratadine (CLARITIN) 10 MG tablet Take 1 tablet by mouth daily.  . nitroGLYCERIN (NITROSTAT) 0.4 MG SL tablet Place 1 tablet (0.4 mg total) under the tongue every 5 (five) minutes as needed for chest pain. If pain not relieved by 3rd dose, call 911.      Allergies:   Ibuprofen and Azithromycin   Social History   Socioeconomic History  . Marital status: Widowed    Spouse name: Not on file  . Number of children: Not on file  . Years of education: Not on file  . Highest education level: Not on file  Occupational History  . Not on file  Tobacco Use  . Smoking status: Never Smoker  . Smokeless tobacco: Never Used  Vaping Use  . Vaping Use: Never used  Substance and Sexual Activity  . Alcohol use: Not Currently  . Drug use: Not Currently  . Sexual activity: Not on file  Other Topics Concern  . Not on file  Social History Narrative  . Not on file   Social Determinants of Health   Financial Resource Strain: Not on file  Food Insecurity: Not on file  Transportation Needs: Not on file  Physical Activity: Not on file  Stress: Not on file  Social Connections: Not on file     Family History: The patient's family history includes Bone cancer in her maternal grandfather; Cerebrovascular Disease in her father; Diabetes in her brother and sister; Heart attack in her brother; Heart disease in her brother and father; Hyperlipidemia in her father, paternal grandfather, and paternal grandmother; Hypertension in her father; Ovarian cancer in her mother; Pancreatic cancer in her sister; Stroke in her brother and father. ROS:   Please see the history of present illness.    All other systems reviewed and are negative.  EKGs/Labs/Other Studies Reviewed:    The following studies were reviewed today:  EKG:  EKG ordered today and personally reviewed.  The ekg ordered today demonstrates EKG today shows sinus rhythm atrial bigeminy 94 bpm   Physical Exam:    VS:  BP 140/76 (BP Location: Left Arm, Patient Position: Sitting)   Pulse 91   Ht 5\' 6"  (1.676 m)   Wt 132 lb 1.3 oz (59.9 kg)   SpO2 99%   BMI 21.32 kg/m     Wt Readings from Last 3 Encounters:  05/05/20 132 lb 1.3 oz (59.9 kg)  10/12/19 134 lb 3.2 oz (60.9 kg)  04/13/19  135 lb 9.6 oz (61.5 kg)     GEN:  Well nourished, well developed in no acute distress HEENT: Normal NECK: No JVD; No carotid bruits LYMPHATICS: No lymphadenopathy CARDIAC: 1-2 of 6 MR RRR, no murmurs, rubs, gallops RESPIRATORY:  Clear to auscultation without rales, wheezing or rhonchi  ABDOMEN: Soft, non-tender, non-distended MUSCULOSKELETAL:  No edema; No deformity  SKIN: Warm and dry NEUROLOGIC:  Alert and oriented x 3 PSYCHIATRIC:  Normal affect    Signed, 04/15/19, MD  05/05/2020 3:15 PM    [facility]

## 2020-05-05 ENCOUNTER — Encounter: Payer: Self-pay | Admitting: Cardiology

## 2020-05-05 ENCOUNTER — Other Ambulatory Visit: Payer: Self-pay

## 2020-05-05 ENCOUNTER — Ambulatory Visit: Payer: Medicare PPO | Admitting: Cardiology

## 2020-05-05 ENCOUNTER — Ambulatory Visit (INDEPENDENT_AMBULATORY_CARE_PROVIDER_SITE_OTHER): Payer: Medicare PPO

## 2020-05-05 VITALS — BP 140/76 | HR 91 | Ht 66.0 in | Wt 132.1 lb

## 2020-05-05 DIAGNOSIS — I10 Essential (primary) hypertension: Secondary | ICD-10-CM | POA: Diagnosis not present

## 2020-05-05 DIAGNOSIS — I340 Nonrheumatic mitral (valve) insufficiency: Secondary | ICD-10-CM | POA: Diagnosis not present

## 2020-05-05 DIAGNOSIS — I493 Ventricular premature depolarization: Secondary | ICD-10-CM

## 2020-05-05 DIAGNOSIS — R001 Bradycardia, unspecified: Secondary | ICD-10-CM | POA: Insufficient documentation

## 2020-05-05 DIAGNOSIS — E782 Mixed hyperlipidemia: Secondary | ICD-10-CM

## 2020-05-05 HISTORY — DX: Bradycardia, unspecified: R00.1

## 2020-05-05 NOTE — Patient Instructions (Signed)
Medication Instructions:  Your physician recommends that you continue on your current medications as directed. Please refer to the Current Medication list given to you today.  *If you need a refill on your cardiac medications before your next appointment, please call your pharmacy*   Lab Work: None If you have labs (blood work) drawn today and your tests are completely normal, you will receive your results only by: . MyChart Message (if you have MyChart) OR . A paper copy in the mail If you have any lab test that is abnormal or we need to change your treatment, we will call you to review the results.   Testing/Procedures: Your physician has requested that you have an echocardiogram. Echocardiography is a painless test that uses sound waves to create images of your heart. It provides your doctor with information about the size and shape of your heart and how well your heart's chambers and valves are working. This procedure takes approximately one hour. There are no restrictions for this procedure.  A zio monitor was ordered today. It will remain on for 14 days. You will then return monitor and event diary in provided box. It takes 1-2 weeks for report to be downloaded and returned to us. We will call you with the results. If monitor falls off or has orange flashing light, please call Zio for further instructions.      Follow-Up: At [facility], you and your health needs are our priority.  As part of our continuing mission to provide you with exceptional heart care, we have created designated Provider Care Teams.  These Care Teams include your primary Cardiologist (physician) and Advanced Practice Providers (APPs -  Physician Assistants and Nurse Practitioners) who all work together to provide you with the care you need, when you need it.  We recommend signing up for the patient portal called "MyChart".  Sign up information is provided on this After Visit Summary.  MyChart is used to  connect with patients for Virtual Visits (Telemedicine).  Patients are able to view lab/test results, encounter notes, upcoming appointments, etc.  Non-urgent messages can be sent to your provider as well.   To learn more about what you can do with MyChart, go to [email or web address].    Your next appointment:   6 week(s)  The format for your next appointment:   In Person  Provider:   Brian Munley, MD   Other Instructions   

## 2020-05-06 DIAGNOSIS — I493 Ventricular premature depolarization: Secondary | ICD-10-CM | POA: Diagnosis not present

## 2020-05-06 DIAGNOSIS — R001 Bradycardia, unspecified: Secondary | ICD-10-CM | POA: Diagnosis not present

## 2020-05-06 DIAGNOSIS — I10 Essential (primary) hypertension: Secondary | ICD-10-CM | POA: Diagnosis not present

## 2020-05-06 DIAGNOSIS — I340 Nonrheumatic mitral (valve) insufficiency: Secondary | ICD-10-CM | POA: Diagnosis not present

## 2020-05-09 DIAGNOSIS — I498 Other specified cardiac arrhythmias: Secondary | ICD-10-CM | POA: Diagnosis not present

## 2020-05-09 DIAGNOSIS — N959 Unspecified menopausal and perimenopausal disorder: Secondary | ICD-10-CM | POA: Diagnosis not present

## 2020-05-09 DIAGNOSIS — I679 Cerebrovascular disease, unspecified: Secondary | ICD-10-CM | POA: Diagnosis not present

## 2020-05-09 DIAGNOSIS — G43109 Migraine with aura, not intractable, without status migrainosus: Secondary | ICD-10-CM | POA: Diagnosis not present

## 2020-05-18 DIAGNOSIS — I10 Essential (primary) hypertension: Secondary | ICD-10-CM | POA: Diagnosis not present

## 2020-05-18 DIAGNOSIS — E871 Hypo-osmolality and hyponatremia: Secondary | ICD-10-CM | POA: Diagnosis not present

## 2020-05-18 DIAGNOSIS — G43109 Migraine with aura, not intractable, without status migrainosus: Secondary | ICD-10-CM | POA: Diagnosis not present

## 2020-05-18 DIAGNOSIS — M545 Low back pain, unspecified: Secondary | ICD-10-CM | POA: Diagnosis not present

## 2020-05-18 DIAGNOSIS — I679 Cerebrovascular disease, unspecified: Secondary | ICD-10-CM | POA: Diagnosis not present

## 2020-05-18 DIAGNOSIS — E7800 Pure hypercholesterolemia, unspecified: Secondary | ICD-10-CM | POA: Diagnosis not present

## 2020-05-18 DIAGNOSIS — J309 Allergic rhinitis, unspecified: Secondary | ICD-10-CM | POA: Diagnosis not present

## 2020-05-18 DIAGNOSIS — I498 Other specified cardiac arrhythmias: Secondary | ICD-10-CM | POA: Diagnosis not present

## 2020-05-18 DIAGNOSIS — N959 Unspecified menopausal and perimenopausal disorder: Secondary | ICD-10-CM | POA: Diagnosis not present

## 2020-05-19 DIAGNOSIS — I10 Essential (primary) hypertension: Secondary | ICD-10-CM | POA: Diagnosis not present

## 2020-05-19 DIAGNOSIS — R001 Bradycardia, unspecified: Secondary | ICD-10-CM | POA: Diagnosis not present

## 2020-05-19 DIAGNOSIS — I340 Nonrheumatic mitral (valve) insufficiency: Secondary | ICD-10-CM

## 2020-05-19 DIAGNOSIS — E782 Mixed hyperlipidemia: Secondary | ICD-10-CM

## 2020-05-19 DIAGNOSIS — I493 Ventricular premature depolarization: Secondary | ICD-10-CM

## 2020-05-23 DIAGNOSIS — Z1231 Encounter for screening mammogram for malignant neoplasm of breast: Secondary | ICD-10-CM | POA: Diagnosis not present

## 2020-05-31 ENCOUNTER — Telehealth: Payer: Self-pay

## 2020-05-31 DIAGNOSIS — I493 Ventricular premature depolarization: Secondary | ICD-10-CM

## 2020-05-31 NOTE — Telephone Encounter (Signed)
-----   Message from Baldo Daub, MD sent at 05/31/2020  9:08 AM EDT ----- Her monitor does not show episodes of slow heart rhythms that are concerning.  She has a high frequency of extra beats about 30% and has multiple episodes of short and rapid heart rhythm.  Its difficult to decide whether treatment is needed and I think she would benefit from seeing one of the electrophysiology doctors and can see Dr. Elberta Fortis in [place].  That is my recommendation.  No new medications at this time.

## 2020-05-31 NOTE — Telephone Encounter (Signed)
Spoke with patient regarding results and recommendation.  Patient verbalizes understanding and is agreeable to plan of care. Advised patient to call back with any issues or concerns.  

## 2020-05-31 NOTE — Telephone Encounter (Signed)
-----   Message from Brian J Munley, MD sent at 05/31/2020  9:08 AM EDT ----- Her monitor does not show episodes of slow heart rhythms that are concerning.  She has a high frequency of extra beats about 30% and has multiple episodes of short and rapid heart rhythm.  Its difficult to decide whether treatment is needed and I think she would benefit from seeing one of the electrophysiology doctors and can see Dr. Camnitz in [place].  That is my recommendation.  No new medications at this time. 

## 2020-05-31 NOTE — Telephone Encounter (Signed)
Left message on patients voicemail to please return our call.   

## 2020-06-02 DIAGNOSIS — E871 Hypo-osmolality and hyponatremia: Secondary | ICD-10-CM | POA: Diagnosis not present

## 2020-06-02 DIAGNOSIS — M545 Low back pain, unspecified: Secondary | ICD-10-CM | POA: Diagnosis not present

## 2020-06-02 DIAGNOSIS — I679 Cerebrovascular disease, unspecified: Secondary | ICD-10-CM | POA: Diagnosis not present

## 2020-06-02 DIAGNOSIS — R300 Dysuria: Secondary | ICD-10-CM | POA: Diagnosis not present

## 2020-06-02 DIAGNOSIS — N959 Unspecified menopausal and perimenopausal disorder: Secondary | ICD-10-CM | POA: Diagnosis not present

## 2020-06-02 DIAGNOSIS — J309 Allergic rhinitis, unspecified: Secondary | ICD-10-CM | POA: Diagnosis not present

## 2020-06-02 DIAGNOSIS — H6692 Otitis media, unspecified, left ear: Secondary | ICD-10-CM | POA: Diagnosis not present

## 2020-06-02 DIAGNOSIS — I498 Other specified cardiac arrhythmias: Secondary | ICD-10-CM | POA: Diagnosis not present

## 2020-06-02 DIAGNOSIS — G43109 Migraine with aura, not intractable, without status migrainosus: Secondary | ICD-10-CM | POA: Diagnosis not present

## 2020-06-06 ENCOUNTER — Encounter: Payer: Self-pay | Admitting: Cardiology

## 2020-06-06 ENCOUNTER — Other Ambulatory Visit: Payer: Self-pay

## 2020-06-06 ENCOUNTER — Ambulatory Visit: Payer: Medicare PPO | Admitting: Cardiology

## 2020-06-06 VITALS — BP 130/68 | HR 70 | Ht 66.0 in | Wt 130.0 lb

## 2020-06-06 DIAGNOSIS — I491 Atrial premature depolarization: Secondary | ICD-10-CM | POA: Diagnosis not present

## 2020-06-06 MED ORDER — METOPROLOL SUCCINATE ER 50 MG PO TB24: 50.0000 mg | ORAL_TABLET | Freq: Every day | ORAL | 3 refills | Status: DC

## 2020-06-06 NOTE — Patient Instructions (Addendum)
Medication Instructions:  Your physician has recommended you make the following change in your medication:  1. START Metoprolol Succinate (Toprol) 50 mg once daily  *If you need a refill on your cardiac medications before your next appointment, please call your pharmacy*   Lab Work: None ordered   Testing/Procedures: None ordered   Follow-Up: At [facility], you and your health needs are our priority.  As part of our continuing mission to provide you with exceptional heart care, we have created designated Provider Care Teams.  These Care Teams include your primary Cardiologist (physician) and Advanced Practice Providers (APPs -  Physician Assistants and Nurse Practitioners) who all work together to provide you with the care you need, when you need it.  Your next appointment:   3 month(s) In [place]  The format for your next appointment:   In Person  Provider:   Loman Brooklyn, MD    Thank you for choosing [facility]!!   Dory Horn, RN [phone]   Other Instructions   Metoprolol Extended-Release Tablets What is this medicine? METOPROLOL (me TOE proe lole) is a beta blocker. It decreases the amount of work your heart has to do and helps your heart beat regularly. It treats high blood pressure and/or prevent chest pain (also called angina). It also treats heart failure. This medicine may be used for other purposes; ask your health care provider or pharmacist if you have questions. COMMON BRAND NAME(S): toprol, Toprol XL What should I tell my health care provider before I take this medicine? They need to know if you have any of these conditions:  diabetes  heart or vessel disease like slow heart rate, worsening heart failure, heart block, sick sinus syndrome or Raynaud's disease  kidney disease  liver disease  lung or breathing disease, like asthma or emphysema  pheochromocytoma  thyroid disease  an unusual or allergic reaction to metoprolol,  other beta-blockers, medicines, foods, dyes, or preservatives  pregnant or trying to get pregnant  breast-feeding How should I use this medicine? Take this drug by mouth. Take it as directed on the prescription label at the same time every day. Take it with food. You may cut the tablet in half if it is scored (has a line in the middle of it). This may help you swallow the tablet if the whole tablet is too big. Be sure to take both halves. Do not take just one-half of the tablet. Keep taking it unless your health care provider tells you to stop. Talk to your health care provider about the use of this drug in children. While it may be prescribed for children as young as 6 for selected conditions, precautions do apply. Overdosage: If you think you have taken too much of this medicine contact a poison control center or emergency room at once. NOTE: This medicine is only for you. Do not share this medicine with others. What if I miss a dose? If you miss a dose, take it as soon as you can. If it is almost time for your next dose, take only that dose. Do not take double or extra doses. What may interact with this medicine? This medicine may interact with the following medications:  certain medicines for blood pressure, heart disease, irregular heart beat  certain medicines for depression, like monoamine oxidase (MAO) inhibitors, fluoxetine, or paroxetine  clonidine  dobutamine  epinephrine  isoproterenol  reserpine This list may not describe all possible interactions. Give your health care provider a list of all  the medicines, herbs, non-prescription drugs, or dietary supplements you use. Also tell them if you smoke, drink alcohol, or use illegal drugs. Some items may interact with your medicine. What should I watch for while using this medicine? Visit your doctor or health care professional for regular check ups. Contact your doctor right away if your symptoms worsen. Check your blood  pressure and pulse rate regularly. Ask your health care professional what your blood pressure and pulse rate should be, and when you should contact them. You may get drowsy or dizzy. Do not drive, use machinery, or do anything that needs mental alertness until you know how this medicine affects you. Do not sit or stand up quickly, especially if you are an older patient. This reduces the risk of dizzy or fainting spells. Contact your doctor if these symptoms continue. Alcohol may interfere with the effect of this medicine. Avoid alcoholic drinks. This medicine may increase blood sugar. Ask your healthcare provider if changes in diet or medicines are needed if you have diabetes. What side effects may I notice from receiving this medicine? Side effects that you should report to your doctor or health care professional as soon as possible:  allergic reactions like skin rash, itching or hives  cold or numb hands or feet  depression  difficulty breathing  faint  fever with sore throat  irregular heartbeat, chest pain  rapid weight gain  signs and symptoms of high blood sugar such as being more thirsty or hungry or having to urinate more than normal. You may also feel very tired or have blurry vision.  swollen legs or ankles Side effects that usually do not require medical attention (report to your doctor or health care professional if they continue or are bothersome):  anxiety or nervousness  change in sex drive or performance  dry skin  headache  nightmares or trouble sleeping  short term memory loss  stomach upset or diarrhea This list may not describe all possible side effects. Call your doctor for medical advice about side effects. You may report side effects to FDA at 1-800-FDA-1088. Where should I keep my medicine? Keep out of the reach of children and pets. Store at room temperature between 20 and 25 degrees C (68 and 77 degrees F). Throw away any unused drug after the  expiration date. NOTE: This sheet is a summary. It may not cover all possible information. If you have questions about this medicine, talk to your doctor, pharmacist, or health care provider.  2021 Elsevier/Gold Standard (2018-08-20 18:23:00)

## 2020-06-06 NOTE — Progress Notes (Signed)
Electrophysiology Office Note   Date:  06/06/2020   ID:  AMAANI GUILBAULT, DOB 10-19-1945, MRN [id number]  PCP:  Simone Curia, MD  Cardiologist:  Dulce Sellar Primary Electrophysiologist:  Shilee Biggs Jorja Loa, MD    Chief Complaint: SVT   History of Present Illness: Olivia Blackburn is a 75 y.o. female who is being seen today for the evaluation of SVT at the request of Dulce Sellar, Iline Oven, MD. Presenting today for electrophysiology evaluation.  She has a history of frequent PACs, PVCs, hypertension, hyperlipidemia, prior stroke.  She has been having episodes of marked weakness.  She has noted heart rates in the 30s at home.  She wore a cardiac monitor that showed a high burden of PACs.  She does feel well most of the time and is able to exercise and exert herself without issue.  There are also times where she feels significant fatigue and shortness of breath as well as weakness.  She initially presented to her primary physician's office and was found to have a UTI and sinus infection as well as an abnormal ECG.  Since these have both been treated, she continues to have episodes.  Today, she denies symptoms of palpitations, chest pain, shortness of breath, orthopnea, PND, lower extremity edema, claudication, dizziness, presyncope, syncope, bleeding, or neurologic sequela. The patient is tolerating medications without difficulties.    Past Medical History:  Diagnosis Date  . Allergic rhinitis 07/04/2017  . APC (atrial premature contractions) 07/07/2017  . Cerebrovascular accident (CVA) (HCC) 07/04/2017  . Closed fracture of base of fifth metatarsal bone 05/16/2017  . Frequent PVCs 08/13/2017  . GERD (gastroesophageal reflux disease) 07/04/2017  . Hyperlipidemia 07/04/2017  . Hypertension   . Hyponatremia 07/04/2017  . Migraine with prolonged aura, not intractable 07/04/2017  . Mitral regurgitation 08/13/2017  . Osteoporosis 07/04/2017  . Palpitations 07/04/2017   Past Surgical History:  Procedure  Laterality Date  . ABDOMINAL HYSTERECTOMY    . CYSTOCELE REPAIR    . TONSILLECTOMY       Current Outpatient Medications  Medication Sig Dispense Refill  . aspirin EC 81 MG tablet Take 81 mg by mouth daily.     . Calcium-Vitamin D-Vitamin K 500-500-40 MG-UNT-MCG CHEW Chew 1 tablet by mouth 2 (two) times daily.     Marland Kitchen conjugated estrogens (PREMARIN) vaginal cream Place 1 Applicatorful vaginally 2 (two) times a week.     . denosumab (PROLIA) 60 MG/ML SOLN injection Inject 60 mg into the skin every 6 (six) months. Administer in upper arm, thigh, or abdomen    . doxycycline (DORYX) 100 MG EC tablet Take 100 mg by mouth 2 (two) times daily.    Marland Kitchen lisinopril (PRINIVIL,ZESTRIL) 20 MG tablet Take 20 mg by mouth daily.    Marland Kitchen loratadine (CLARITIN) 10 MG tablet Take 1 tablet by mouth daily.    . metoprolol succinate (TOPROL-XL) 50 MG 24 hr tablet Take 1 tablet (50 mg total) by mouth daily. Take with or immediately following a meal. 30 tablet 3  . nitroGLYCERIN (NITROSTAT) 0.4 MG SL tablet Place 1 tablet (0.4 mg total) under the tongue every 5 (five) minutes as needed for chest pain. If pain not relieved by 3rd dose, call 911. 30 tablet 0   No current facility-administered medications for this visit.    Allergies:   Ibuprofen and Azithromycin   Social History:  The patient  reports that she has never smoked. She has never used smokeless tobacco. She reports previous alcohol use. She reports previous  drug use.   Family History:  The patient's family history includes Bone cancer in her maternal grandfather; Cerebrovascular Disease in her father; Diabetes in her brother and sister; Heart attack in her brother; Heart disease in her brother and father; Hyperlipidemia in her father, paternal grandfather, and paternal grandmother; Hypertension in her father; Ovarian cancer in her mother; Pancreatic cancer in her sister; Stroke in her brother and father.    ROS:  Please see the history of present illness.    Otherwise, review of systems is positive for none.   All other systems are reviewed and negative.    PHYSICAL EXAM: VS:  BP 130/68   Pulse 70   Ht 5\' 6"  (1.676 m)   Wt 130 lb (59 kg)   SpO2 98%   BMI 20.98 kg/m  , BMI Body mass index is 20.98 kg/m. GEN: Well nourished, well developed, in no acute distress  HEENT: normal  Neck: no JVD, carotid bruits, or masses Cardiac: RRR; no murmurs, rubs, or gallops,no edema  Respiratory:  clear to auscultation bilaterally, normal work of breathing GI: soft, nontender, nondistended, + BS MS: no deformity or atrophy  Skin: warm and dry Neuro:  Strength and sensation are intact Psych: euthymic mood, full affect  EKG:  EKG is not ordered today. Personal review of the ekg ordered 05/05/20 shows sinus rhythm, atrial bigeminy  Recent Labs: No results found for requested labs within last 8760 hours.    Lipid Panel  No results found for: CHOL, TRIG, HDL, CHOLHDL, VLDL, LDLCALC, LDLDIRECT   Wt Readings from Last 3 Encounters:  06/06/20 130 lb (59 kg)  05/05/20 132 lb 1.3 oz (59.9 kg)  10/12/19 134 lb 3.2 oz (60.9 kg)      Other studies Reviewed: Additional studies/ records that were reviewed today include: TTE 03/08/19  Review of the above records today demonstrates:  1. Left ventricular ejection fraction, by estimation, is 55%. The left  ventricle has low normal function. The left ventricle has no regional wall  motion abnormalities. Left ventricular diastolic parameters are consistent  with Grade II diastolic  dysfunction (pseudonormalization).  2. Right ventricular systolic function is normal. The right ventricular  size is normal. There is normal pulmonary artery systolic pressure. The  estimated right ventricular systolic pressure is 27.0 mmHg.  3. Left atrial size was mild to moderately dilated.  4. The mitral valve is normal in structure and function. Moderate central  mitral valve regurgitation, appears functional. No  evidence of mitral  stenosis.  5. The aortic valve is tricuspid. Aortic valve regurgitation is not  visualized. No aortic stenosis is present.  6. The inferior vena cava is normal in size with greater than 50%  respiratory variability, suggesting right atrial pressure of 3 mmHg.   Monitor 05/30/2020 personally reviewed frequent supraventricular ectopy with frequent brief episodes of SVT.   ASSESSMENT AND PLAN:  1.  PACs: Has quite a few symptoms of fatigue and shortness of breath.  She does have a very high burden of PACs, upwards of 30%.  This could be the cause of her symptoms.  We Christin Mccreedy start her on metoprolol to see if this reduces her burden.  If not, flecainide may be another option.  2.  Hypertension: Currently well controlled  Case discussed with cardiology  Current medicines are reviewed at length with the patient today.   The patient does not have concerns regarding her medicines.  The following changes were made today:  none  Labs/ tests ordered today  include:  No orders of the defined types were placed in this encounter.    Disposition:   FU with Jayvian Escoe 3 months  Signed, Courtenay Hirth Jorja Loa, MD  06/06/2020 11:43 AM     [facility] [address] [place] [address] [phone] (office) [phone] (fax)

## 2020-06-07 ENCOUNTER — Ambulatory Visit (INDEPENDENT_AMBULATORY_CARE_PROVIDER_SITE_OTHER): Payer: Medicare PPO

## 2020-06-07 DIAGNOSIS — I493 Ventricular premature depolarization: Secondary | ICD-10-CM

## 2020-06-07 DIAGNOSIS — E782 Mixed hyperlipidemia: Secondary | ICD-10-CM | POA: Diagnosis not present

## 2020-06-07 DIAGNOSIS — R001 Bradycardia, unspecified: Secondary | ICD-10-CM

## 2020-06-07 DIAGNOSIS — I10 Essential (primary) hypertension: Secondary | ICD-10-CM

## 2020-06-07 DIAGNOSIS — I340 Nonrheumatic mitral (valve) insufficiency: Secondary | ICD-10-CM | POA: Diagnosis not present

## 2020-06-07 NOTE — Progress Notes (Signed)
Complete echocardiogram performed.  Jimmy Mallory Schaad RDCS, RVT  

## 2020-06-13 LAB — ECHOCARDIOGRAM COMPLETE
Area-P 1/2: 2.74 cm2
S' Lateral: 2.8 cm

## 2020-06-14 ENCOUNTER — Telehealth: Payer: Self-pay

## 2020-06-14 NOTE — Telephone Encounter (Signed)
Spoke with patient regarding results and recommendation.  Patient verbalizes understanding and is agreeable to plan of care. Advised patient to call back with any issues or concerns.  

## 2020-06-14 NOTE — Telephone Encounter (Signed)
Left message on patients voicemail to please return our call.   

## 2020-06-14 NOTE — Telephone Encounter (Signed)
-----   Message from Brian J Munley, MD sent at 06/14/2020  7:54 AM EDT ----- Overall good result heart muscle function is normal and leakage regurgitation mitral valve is only mild. 

## 2020-06-14 NOTE — Telephone Encounter (Signed)
-----   Message from Baldo Daub, MD sent at 06/14/2020  7:54 AM EDT ----- Overall good result heart muscle function is normal and leakage regurgitation mitral valve is only mild.

## 2020-06-15 DIAGNOSIS — I10 Essential (primary) hypertension: Secondary | ICD-10-CM | POA: Diagnosis not present

## 2020-06-15 DIAGNOSIS — I498 Other specified cardiac arrhythmias: Secondary | ICD-10-CM | POA: Diagnosis not present

## 2020-06-15 DIAGNOSIS — J309 Allergic rhinitis, unspecified: Secondary | ICD-10-CM | POA: Diagnosis not present

## 2020-06-15 DIAGNOSIS — I679 Cerebrovascular disease, unspecified: Secondary | ICD-10-CM | POA: Diagnosis not present

## 2020-06-27 NOTE — Progress Notes (Signed)
Cardiology Office Note:    Date:  06/28/2020   ID:  Sarajane Jews, DOB 02/20/1945, MRN [id number]  PCP:  Simone Curia, MD  Cardiologist:  Norman Herrlich, MD    Referring MD: Simone Curia, MD    ASSESSMENT:    1. APC (atrial premature contractions)   2. SVT (supraventricular tachycardia) (HCC)   3. Premature ventricular contractions (PVCs) (VPCs)   4. Non-rheumatic mitral regurgitation   5. Primary hypertension    PLAN:    In order of problems listed above:  1. Improved tolerating beta-blocker we will apply a 3-day event monitor to objectively see if her atrial arrhythmias improved. 2. Mild mitral regurgitation recent echo 3. BP at target continue current treatment including ACE inhibitor   Next appointment: 6 months   Medication Adjustments/Labs and Tests Ordered: Current medicines are reviewed at length with the patient today.  Concerns regarding medicines are outlined above.  Orders Placed This Encounter  Procedures  . LONG TERM MONITOR (3-14 DAYS)   No orders of the defined types were placed in this encounter.   Chief Complaint  Patient presents with  . Follow-up    She had a very high frequency of SVE and PAT    History of Present Illness:    Olivia Blackburn is a 74 y.o. female with a hx of frequent APCs PVCs hypertension hyperlipidemia and previous stroke last seen by Dr. Elberta Fortis EP consultation 06/16/2020.  Her event monitor reported 05/29/2020 showed a high frequency of SVE 32.3% and frequent brief episodes of atrial tachycardia.  Her echocardiogram 06/07/2020 showed normal EF 55 to 60% mild mitral regurgitation mild enlargement of the ascending aorta 37 mm Compliance with diet, lifestyle and medications: Yes  At first she had difficulty tolerating the beta-blocker with fatigue she is adjusting says she feels much better and no longer is having episodes of rapid heart rhythm.  No chest pain syncope or shortness of breath. Past Medical History:  Diagnosis Date  .  Allergic rhinitis 07/04/2017  . APC (atrial premature contractions) 07/07/2017  . Cerebrovascular accident (CVA) (HCC) 07/04/2017  . Closed fracture of base of fifth metatarsal bone 05/16/2017  . Frequent PVCs 08/13/2017  . GERD (gastroesophageal reflux disease) 07/04/2017  . Hyperlipidemia 07/04/2017  . Hypertension   . Hyponatremia 07/04/2017  . Migraine with prolonged aura, not intractable 07/04/2017  . Mitral regurgitation 08/13/2017  . Osteoporosis 07/04/2017  . Palpitations 07/04/2017    Past Surgical History:  Procedure Laterality Date  . ABDOMINAL HYSTERECTOMY    . CYSTOCELE REPAIR    . TONSILLECTOMY      Current Medications: Current Meds  Medication Sig  . aspirin EC 81 MG tablet Take 81 mg by mouth daily.   . Calcium-Vitamin D-Vitamin K 500-500-40 MG-UNT-MCG CHEW Chew 1 tablet by mouth 2 (two) times daily.   Marland Kitchen conjugated estrogens (PREMARIN) vaginal cream Place 1 Applicatorful vaginally 2 (two) times a week.   . denosumab (PROLIA) 60 MG/ML SOLN injection Inject 60 mg into the skin every 6 (six) months. Administer in upper arm, thigh, or abdomen  . lisinopril (PRINIVIL,ZESTRIL) 20 MG tablet Take 20 mg by mouth daily.  Marland Kitchen loratadine (CLARITIN) 10 MG tablet Take 1 tablet by mouth daily.  . metoprolol succinate (TOPROL-XL) 50 MG 24 hr tablet Take 1 tablet (50 mg total) by mouth daily. Take with or immediately following a meal.  . nitroGLYCERIN (NITROSTAT) 0.4 MG SL tablet Place 1 tablet (0.4 mg total) under the tongue every 5 (five) minutes as  needed for chest pain. If pain not relieved by 3rd dose, call 911.     Allergies:   Ibuprofen and Azithromycin   Social History   Socioeconomic History  . Marital status: Widowed    Spouse name: Not on file  . Number of children: Not on file  . Years of education: Not on file  . Highest education level: Not on file  Occupational History  . Not on file  Tobacco Use  . Smoking status: Never Smoker  . Smokeless tobacco: Never Used   Vaping Use  . Vaping Use: Never used  Substance and Sexual Activity  . Alcohol use: Not Currently  . Drug use: Not Currently  . Sexual activity: Not on file  Other Topics Concern  . Not on file  Social History Narrative  . Not on file   Social Determinants of Health   Financial Resource Strain: Not on file  Food Insecurity: Not on file  Transportation Needs: Not on file  Physical Activity: Not on file  Stress: Not on file  Social Connections: Not on file     Family History: The patient's family history includes Bone cancer in her maternal grandfather; Cerebrovascular Disease in her father; Diabetes in her brother and sister; Heart attack in her brother; Heart disease in her brother and father; Hyperlipidemia in her father, paternal grandfather, and paternal grandmother; Hypertension in her father; Ovarian cancer in her mother; Pancreatic cancer in her sister; Stroke in her brother and father. ROS:   Please see the history of present illness.    All other systems reviewed and are negative.  EKGs/Labs/Other Studies Reviewed:    The following studies were reviewed today:  Recent Labs: 05/18/2020: Cholesterol 208 LDL 132 triglycerides 79 HDL 62  Physical Exam:    VS:  BP 128/70   Pulse 60   Ht 5\' 6"  (1.676 m)   Wt 133 lb 12.8 oz (60.7 kg)   SpO2 96%   BMI 21.60 kg/m     Wt Readings from Last 3 Encounters:  06/28/20 133 lb 12.8 oz (60.7 kg)  06/06/20 130 lb (59 kg)  05/05/20 132 lb 1.3 oz (59.9 kg)     GEN:  Well nourished, well developed in no acute distress HEENT: Normal NECK: No JVD; No carotid bruits LYMPHATICS: No lymphadenopathy CARDIAC: RRR, no murmurs, rubs, gallops RESPIRATORY:  Clear to auscultation without rales, wheezing or rhonchi  ABDOMEN: Soft, non-tender, non-distended MUSCULOSKELETAL:  No edema; No deformity  SKIN: Warm and dry NEUROLOGIC:  Alert and oriented x 3 PSYCHIATRIC:  Normal affect    Signed, 05/07/20, MD  06/28/2020 1:35 PM     [facility]

## 2020-06-28 ENCOUNTER — Encounter: Payer: Self-pay | Admitting: Cardiology

## 2020-06-28 ENCOUNTER — Other Ambulatory Visit: Payer: Self-pay

## 2020-06-28 ENCOUNTER — Ambulatory Visit (INDEPENDENT_AMBULATORY_CARE_PROVIDER_SITE_OTHER): Payer: Medicare PPO

## 2020-06-28 ENCOUNTER — Ambulatory Visit: Payer: Medicare PPO | Admitting: Cardiology

## 2020-06-28 VITALS — BP 128/70 | HR 60 | Ht 66.0 in | Wt 133.8 lb

## 2020-06-28 DIAGNOSIS — I493 Ventricular premature depolarization: Secondary | ICD-10-CM

## 2020-06-28 DIAGNOSIS — I10 Essential (primary) hypertension: Secondary | ICD-10-CM | POA: Diagnosis not present

## 2020-06-28 DIAGNOSIS — I340 Nonrheumatic mitral (valve) insufficiency: Secondary | ICD-10-CM | POA: Diagnosis not present

## 2020-06-28 DIAGNOSIS — I471 Supraventricular tachycardia: Secondary | ICD-10-CM

## 2020-06-28 DIAGNOSIS — I491 Atrial premature depolarization: Secondary | ICD-10-CM | POA: Diagnosis not present

## 2020-06-28 NOTE — Patient Instructions (Signed)
Medication Instructions:  Your physician recommends that you continue on your current medications as directed. Please refer to the Current Medication list given to you today.  *If you need a refill on your cardiac medications before your next appointment, please call your pharmacy*   Lab Work: None If you have labs (blood work) drawn today and your tests are completely normal, you will receive your results only by: . MyChart Message (if you have MyChart) OR . A paper copy in the mail If you have any lab test that is abnormal or we need to change your treatment, we will call you to review the results.   Testing/Procedures: A zio monitor was ordered today. It will remain on for 3 days. You will then return monitor and event diary in provided box. It takes 1-2 weeks for report to be downloaded and returned to us. We will call you with the results. If monitor falls off or has orange flashing light, please call Zio for further instructions.      Follow-Up: At [facility], you and your health needs are our priority.  As part of our continuing mission to provide you with exceptional heart care, we have created designated Provider Care Teams.  These Care Teams include your primary Cardiologist (physician) and Advanced Practice Providers (APPs -  Physician Assistants and Nurse Practitioners) who all work together to provide you with the care you need, when you need it.  We recommend signing up for the patient portal called "MyChart".  Sign up information is provided on this After Visit Summary.  MyChart is used to connect with patients for Virtual Visits (Telemedicine).  Patients are able to view lab/test results, encounter notes, upcoming appointments, etc.  Non-urgent messages can be sent to your provider as well.   To learn more about what you can do with MyChart, go to [email or web address].    Your next appointment:   6 month(s)  The format for your next appointment:   In  Person  Provider:   Brian Munley, MD   Other Instructions   

## 2020-07-05 DIAGNOSIS — I493 Ventricular premature depolarization: Secondary | ICD-10-CM | POA: Diagnosis not present

## 2020-07-07 DIAGNOSIS — M810 Age-related osteoporosis without current pathological fracture: Secondary | ICD-10-CM | POA: Diagnosis not present

## 2020-07-10 ENCOUNTER — Telehealth: Payer: Self-pay | Admitting: Cardiology

## 2020-07-10 NOTE — Telephone Encounter (Signed)
Olivia Blackburn is returning Candice's call from Friday in regards to her results. Please advise.

## 2020-07-10 NOTE — Telephone Encounter (Signed)
Spoke with patient regarding results and recommendation.  Patient verbalizes understanding and is agreeable to plan of care. Advised patient to call back with any issues or concerns.  

## 2020-07-15 DIAGNOSIS — I679 Cerebrovascular disease, unspecified: Secondary | ICD-10-CM | POA: Diagnosis not present

## 2020-07-15 DIAGNOSIS — I498 Other specified cardiac arrhythmias: Secondary | ICD-10-CM | POA: Diagnosis not present

## 2020-07-15 DIAGNOSIS — J309 Allergic rhinitis, unspecified: Secondary | ICD-10-CM | POA: Diagnosis not present

## 2020-07-15 DIAGNOSIS — E7800 Pure hypercholesterolemia, unspecified: Secondary | ICD-10-CM | POA: Diagnosis not present

## 2020-07-20 DIAGNOSIS — L300 Nummular dermatitis: Secondary | ICD-10-CM | POA: Diagnosis not present

## 2020-07-20 DIAGNOSIS — L814 Other melanin hyperpigmentation: Secondary | ICD-10-CM | POA: Diagnosis not present

## 2020-07-20 DIAGNOSIS — D2239 Melanocytic nevi of other parts of face: Secondary | ICD-10-CM | POA: Diagnosis not present

## 2020-07-20 DIAGNOSIS — D485 Neoplasm of uncertain behavior of skin: Secondary | ICD-10-CM | POA: Diagnosis not present

## 2020-07-20 DIAGNOSIS — D225 Melanocytic nevi of trunk: Secondary | ICD-10-CM | POA: Diagnosis not present

## 2020-08-06 DIAGNOSIS — T63461A Toxic effect of venom of wasps, accidental (unintentional), initial encounter: Secondary | ICD-10-CM | POA: Diagnosis not present

## 2020-08-12 DIAGNOSIS — T466X5A Adverse effect of antihyperlipidemic and antiarteriosclerotic drugs, initial encounter: Secondary | ICD-10-CM | POA: Diagnosis not present

## 2020-08-12 DIAGNOSIS — I10 Essential (primary) hypertension: Secondary | ICD-10-CM | POA: Diagnosis not present

## 2020-08-12 DIAGNOSIS — Z1331 Encounter for screening for depression: Secondary | ICD-10-CM | POA: Diagnosis not present

## 2020-08-12 DIAGNOSIS — E7800 Pure hypercholesterolemia, unspecified: Secondary | ICD-10-CM | POA: Diagnosis not present

## 2020-08-12 DIAGNOSIS — J309 Allergic rhinitis, unspecified: Secondary | ICD-10-CM | POA: Diagnosis not present

## 2020-08-12 DIAGNOSIS — I679 Cerebrovascular disease, unspecified: Secondary | ICD-10-CM | POA: Diagnosis not present

## 2020-08-12 DIAGNOSIS — I498 Other specified cardiac arrhythmias: Secondary | ICD-10-CM | POA: Diagnosis not present

## 2020-08-12 DIAGNOSIS — Z9181 History of falling: Secondary | ICD-10-CM | POA: Diagnosis not present

## 2020-08-12 DIAGNOSIS — G720 Drug-induced myopathy: Secondary | ICD-10-CM | POA: Diagnosis not present

## 2020-08-25 DIAGNOSIS — E785 Hyperlipidemia, unspecified: Secondary | ICD-10-CM | POA: Diagnosis not present

## 2020-08-25 DIAGNOSIS — Z0000 Encounter for general adult medical examination without abnormal findings: Secondary | ICD-10-CM | POA: Diagnosis not present

## 2020-08-25 DIAGNOSIS — Z1331 Encounter for screening for depression: Secondary | ICD-10-CM | POA: Diagnosis not present

## 2020-08-25 DIAGNOSIS — Z9181 History of falling: Secondary | ICD-10-CM | POA: Diagnosis not present

## 2020-09-12 DIAGNOSIS — H6121 Impacted cerumen, right ear: Secondary | ICD-10-CM | POA: Diagnosis not present

## 2020-09-12 DIAGNOSIS — H61301 Acquired stenosis of right external ear canal, unspecified: Secondary | ICD-10-CM | POA: Diagnosis not present

## 2020-09-12 DIAGNOSIS — H9190 Unspecified hearing loss, unspecified ear: Secondary | ICD-10-CM | POA: Diagnosis not present

## 2020-09-17 NOTE — Progress Notes (Signed)
Electrophysiology Office Note   Date:  09/18/2020   ID:  Olivia Blackburn, DOB [birth year], MRN [id number]  PCP:  Simone Curia, MD  Cardiologist:  Dulce Sellar Primary Electrophysiologist:  Dasha Kawabata Jorja Loa, MD    Chief Complaint: SVT   History of Present Illness: Olivia Blackburn is a 75 y.o. female who is being seen today for the evaluation of SVT at the request of Simone Curia, MD. Presenting today for electrophysiology evaluation.  She has a history significant for frequent PACs, PVCs, hypertension, hyperlipidemia, prior stroke.  She had been having episodes of marked weakness.  Her heart rates were in the 30s at home.  She wore a cardiac monitor that showed a high PAC burden.  She feels well most of the time and is able to exercise and exert herself without issue.  She also has times where she feels short of breath with fatigue and weakness.  She was started on metoprolol for her PACs.  Today, denies symptoms of palpitations, chest pain, shortness of breath, orthopnea, PND, lower extremity edema, claudication, dizziness, presyncope, syncope, bleeding, or neurologic sequela. The patient is tolerating medications without difficulties.  Since being seen she has done well.  When she started the metoprolol, her palpitations improved greatly.  She is able to do all of her daily activities and is not restricted.   Past Medical History:  Diagnosis Date   Allergic rhinitis 07/04/2017   APC (atrial premature contractions) 07/07/2017   Cerebrovascular accident (CVA) (HCC) 07/04/2017   Closed fracture of base of fifth metatarsal bone 05/16/2017   Frequent PVCs 08/13/2017   GERD (gastroesophageal reflux disease) 07/04/2017   Hyperlipidemia 07/04/2017   Hypertension    Hyponatremia 07/04/2017   Migraine with prolonged aura, not intractable 07/04/2017   Mitral regurgitation 08/13/2017   Osteoporosis 07/04/2017   Palpitations 07/04/2017   Past Surgical History:  Procedure Laterality Date   ABDOMINAL  HYSTERECTOMY     CYSTOCELE REPAIR     TONSILLECTOMY       Current Outpatient Medications  Medication Sig Dispense Refill   aspirin EC 81 MG tablet Take 81 mg by mouth daily.      Calcium-Vitamin D-Vitamin K 500-500-40 MG-UNT-MCG CHEW Chew 1 tablet by mouth 2 (two) times daily.      conjugated estrogens (PREMARIN) vaginal cream Place 1 Applicatorful vaginally 2 (two) times a week.      denosumab (PROLIA) 60 MG/ML SOLN injection Inject 60 mg into the skin every 6 (six) months. Administer in upper arm, thigh, or abdomen     lisinopril (PRINIVIL,ZESTRIL) 20 MG tablet Take 20 mg by mouth daily.     loratadine (CLARITIN) 10 MG tablet Take 1 tablet by mouth daily.     nitroGLYCERIN (NITROSTAT) 0.4 MG SL tablet Place 1 tablet (0.4 mg total) under the tongue every 5 (five) minutes as needed for chest pain. If pain not relieved by 3rd dose, call 911. 30 tablet 0   metoprolol succinate (TOPROL-XL) 50 MG 24 hr tablet Take 1 tablet (50 mg total) by mouth daily. Take with or immediately following a meal. 90 tablet 3   No current facility-administered medications for this visit.    Allergies:   Ibuprofen and Azithromycin   Social History:  The patient  reports that she has never smoked. She has never used smokeless tobacco. She reports that she does not currently use alcohol. She reports that she does not currently use drugs.   Family History:  The patient's family history includes Bone cancer  in her maternal grandfather; Cerebrovascular Disease in her father; Diabetes in her brother and sister; Heart attack in her brother; Heart disease in her brother and father; Hyperlipidemia in her father, paternal grandfather, and paternal grandmother; Hypertension in her father; Ovarian cancer in her mother; Pancreatic cancer in her sister; Stroke in her brother and father.    ROS:  Please see the history of present illness.   Otherwise, review of systems is positive for none.   All other systems are reviewed and  negative.   PHYSICAL EXAM: VS:  BP (!) 144/66   Pulse 67   Ht 5\' 6"  (1.676 m)   Wt 132 lb (59.9 kg)   SpO2 98%   BMI 21.31 kg/m  , BMI Body mass index is 21.31 kg/m. GEN: Well nourished, well developed, in no acute distress  HEENT: normal  Neck: no JVD, carotid bruits, or masses Cardiac: RRR; no murmurs, rubs, or gallops,no edema  Respiratory:  clear to auscultation bilaterally, normal work of breathing GI: soft, nontender, nondistended, + BS MS: no deformity or atrophy  Skin: warm and dry, device site well healed Neuro:  Strength and sensation are intact Psych: euthymic mood, full affect  EKG:  EKG is ordered today. Personal review of the ekg ordered shows sinus rhythm, PACs  Personal review of the device interrogation today. Results in Paceart   Recent Labs: No results found for requested labs within last 8760 hours.    Lipid Panel  No results found for: CHOL, TRIG, HDL, CHOLHDL, VLDL, LDLCALC, LDLDIRECT   Wt Readings from Last 3 Encounters:  09/18/20 132 lb (59.9 kg)  06/28/20 133 lb 12.8 oz (60.7 kg)  06/06/20 130 lb (59 kg)      Other studies Reviewed: Additional studies/ records that were reviewed today include: TTE 03/08/19  Review of the above records today demonstrates:   1. Left ventricular ejection fraction, by estimation, is 55%. The left  ventricle has low normal function. The left ventricle has no regional wall  motion abnormalities. Left ventricular diastolic parameters are consistent  with Grade II diastolic  dysfunction (pseudonormalization).   2. Right ventricular systolic function is normal. The right ventricular  size is normal. There is normal pulmonary artery systolic pressure. The  estimated right ventricular systolic pressure is 27.0 mmHg.   3. Left atrial size was mild to moderately dilated.   4. The mitral valve is normal in structure and function. Moderate central  mitral valve regurgitation, appears functional. No evidence of mitral   stenosis.   5. The aortic valve is tricuspid. Aortic valve regurgitation is not  visualized. No aortic stenosis is present.   6. The inferior vena cava is normal in size with greater than 50%  respiratory variability, suggesting right atrial pressure of 3 mmHg.   Monitor 05/30/2020 personally reviewed frequent supraventricular ectopy with frequent brief episodes of SVT.   ASSESSMENT AND PLAN:  1.  PACs: Has symptoms of fatigue and shortness of breath.  Is a high burden, upwards of 30%.  This could certainly be the cause of her issues.  Currently on metoprolol.  Since starting on the metoprolol, she is felt much improved with minimal palpitations.  We Yonatan Guitron continue with current management.  2.  Hypertension: Currently well controlled  Current medicines are reviewed at length with the patient today.   The patient does not have concerns regarding her medicines.  The following changes were made today: None  Labs/ tests ordered today include:  Orders Placed This Encounter  Procedures   EKG 12-Lead      Disposition:   FU with Mina Babula 6 months  Signed, Haiven Nardone Jorja Loa, MD  09/18/2020 12:20 PM     [facility] [address] [place] [address] [phone] (office) [phone] (fax)

## 2020-09-18 ENCOUNTER — Encounter: Payer: Self-pay | Admitting: Cardiology

## 2020-09-18 ENCOUNTER — Ambulatory Visit: Payer: Medicare PPO | Admitting: Cardiology

## 2020-09-18 ENCOUNTER — Other Ambulatory Visit: Payer: Self-pay

## 2020-09-18 VITALS — BP 144/66 | HR 67 | Ht 66.0 in | Wt 132.0 lb

## 2020-09-18 DIAGNOSIS — I491 Atrial premature depolarization: Secondary | ICD-10-CM

## 2020-09-18 MED ORDER — METOPROLOL SUCCINATE ER 50 MG PO TB24: 50.0000 mg | ORAL_TABLET | Freq: Every day | ORAL | 3 refills | Status: DC

## 2020-09-18 NOTE — Patient Instructions (Signed)

## 2020-10-18 DIAGNOSIS — L650 Telogen effluvium: Secondary | ICD-10-CM | POA: Diagnosis not present

## 2020-10-18 DIAGNOSIS — L820 Inflamed seborrheic keratosis: Secondary | ICD-10-CM | POA: Diagnosis not present

## 2020-11-12 IMAGING — DX DG CHEST 1V PORT
1 series · 1 of 1 positions shown · non-contrast
Comparison: None.

CLINICAL DATA: Intermittent chest pain for 4 days

EXAM:
PORTABLE CHEST 1 VIEW

[chest ap]
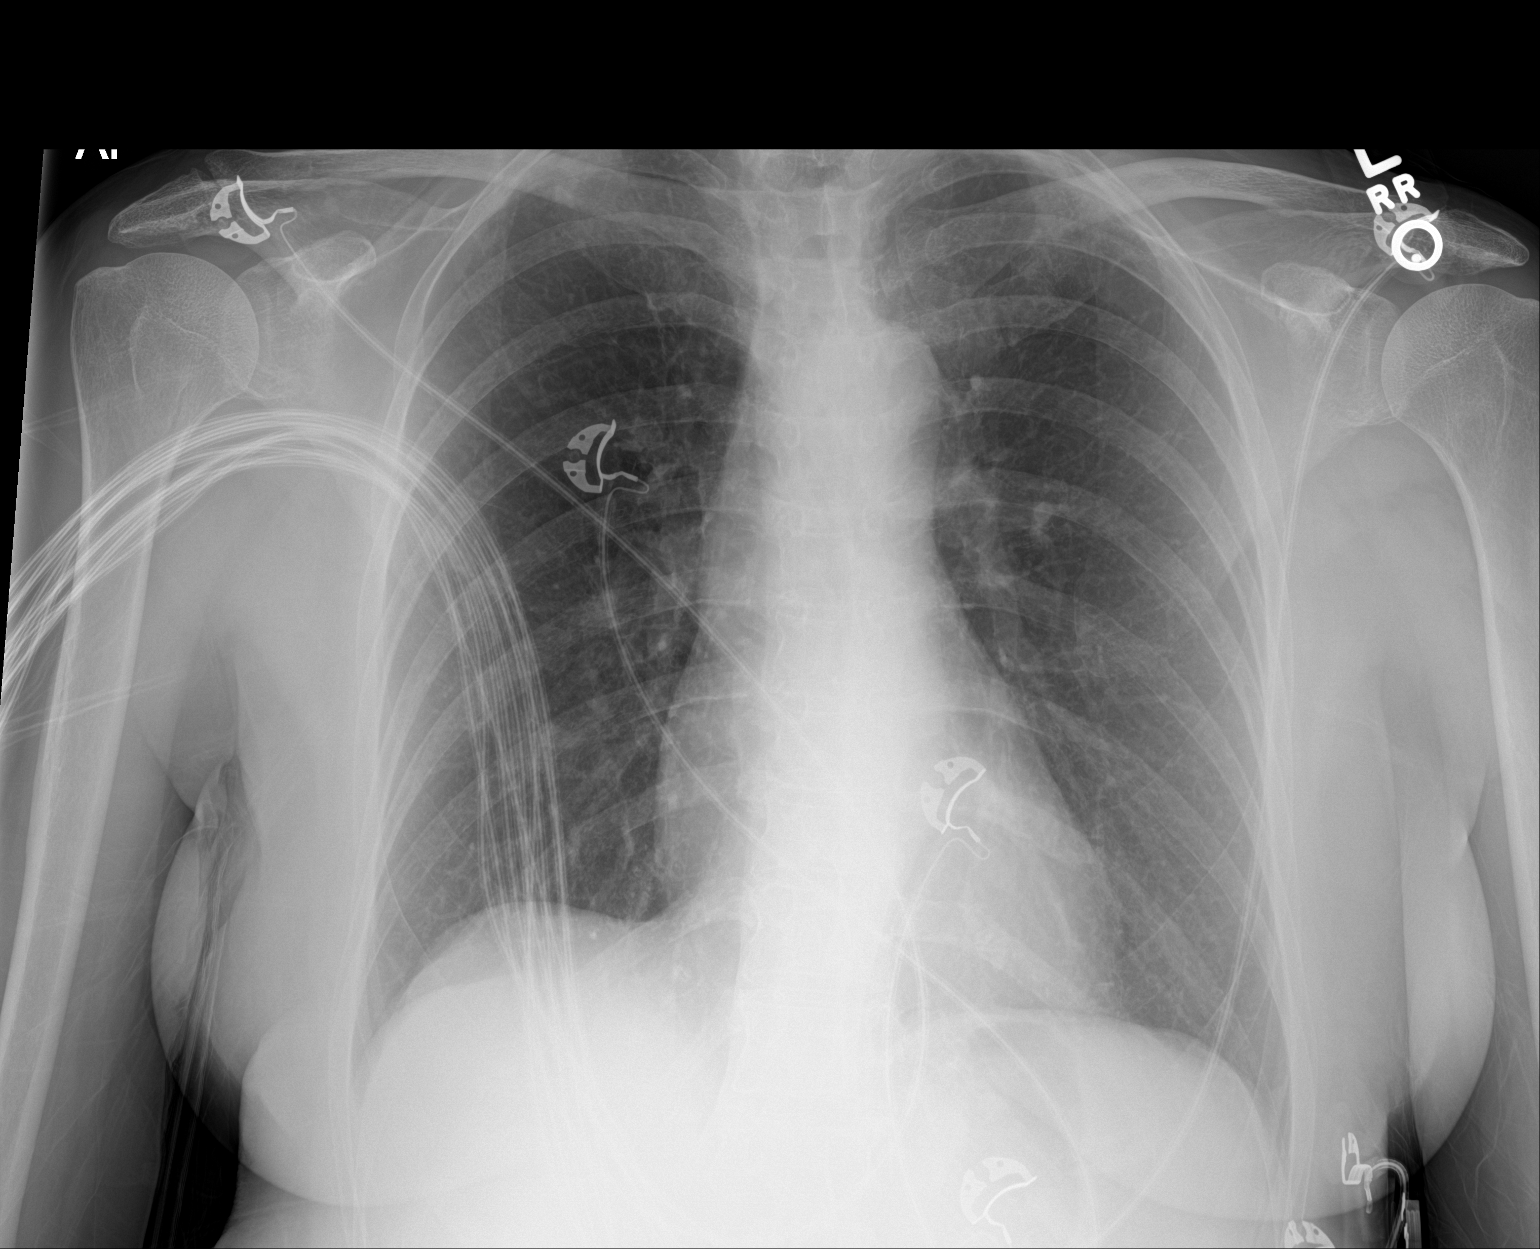

[1 of 1 positions shown; findings below may reference images not displayed]

FINDINGS: The heart size and mediastinal contours are within normal limits.
Both lungs are clear. The visualized skeletal structures are
unremarkable.
IMPRESSION: No active disease.

## 2020-11-14 DIAGNOSIS — L659 Nonscarring hair loss, unspecified: Secondary | ICD-10-CM | POA: Diagnosis not present

## 2020-11-14 DIAGNOSIS — N898 Other specified noninflammatory disorders of vagina: Secondary | ICD-10-CM | POA: Diagnosis not present

## 2020-11-14 DIAGNOSIS — K121 Other forms of stomatitis: Secondary | ICD-10-CM | POA: Diagnosis not present

## 2020-11-14 DIAGNOSIS — E785 Hyperlipidemia, unspecified: Secondary | ICD-10-CM | POA: Diagnosis not present

## 2020-11-14 DIAGNOSIS — I10 Essential (primary) hypertension: Secondary | ICD-10-CM | POA: Diagnosis not present

## 2020-11-14 DIAGNOSIS — Z79620 Long term (current) use of immunosuppressive biologic: Secondary | ICD-10-CM | POA: Diagnosis not present

## 2020-11-14 DIAGNOSIS — M810 Age-related osteoporosis without current pathological fracture: Secondary | ICD-10-CM | POA: Diagnosis not present

## 2020-11-14 DIAGNOSIS — K219 Gastro-esophageal reflux disease without esophagitis: Secondary | ICD-10-CM | POA: Diagnosis not present

## 2020-11-14 DIAGNOSIS — R42 Dizziness and giddiness: Secondary | ICD-10-CM | POA: Diagnosis not present

## 2020-11-21 DIAGNOSIS — I498 Other specified cardiac arrhythmias: Secondary | ICD-10-CM | POA: Diagnosis not present

## 2020-11-21 DIAGNOSIS — J309 Allergic rhinitis, unspecified: Secondary | ICD-10-CM | POA: Diagnosis not present

## 2020-11-21 DIAGNOSIS — N959 Unspecified menopausal and perimenopausal disorder: Secondary | ICD-10-CM | POA: Diagnosis not present

## 2020-11-21 DIAGNOSIS — T466X5A Adverse effect of antihyperlipidemic and antiarteriosclerotic drugs, initial encounter: Secondary | ICD-10-CM | POA: Diagnosis not present

## 2020-11-21 DIAGNOSIS — I679 Cerebrovascular disease, unspecified: Secondary | ICD-10-CM | POA: Diagnosis not present

## 2020-11-21 DIAGNOSIS — N390 Urinary tract infection, site not specified: Secondary | ICD-10-CM | POA: Diagnosis not present

## 2020-11-21 DIAGNOSIS — I10 Essential (primary) hypertension: Secondary | ICD-10-CM | POA: Diagnosis not present

## 2020-11-21 DIAGNOSIS — G720 Drug-induced myopathy: Secondary | ICD-10-CM | POA: Diagnosis not present

## 2020-11-21 DIAGNOSIS — E7800 Pure hypercholesterolemia, unspecified: Secondary | ICD-10-CM | POA: Diagnosis not present

## 2020-11-28 DIAGNOSIS — H2513 Age-related nuclear cataract, bilateral: Secondary | ICD-10-CM | POA: Diagnosis not present

## 2020-12-04 DIAGNOSIS — E7800 Pure hypercholesterolemia, unspecified: Secondary | ICD-10-CM | POA: Diagnosis not present

## 2020-12-04 DIAGNOSIS — G43109 Migraine with aura, not intractable, without status migrainosus: Secondary | ICD-10-CM | POA: Diagnosis not present

## 2020-12-04 DIAGNOSIS — I498 Other specified cardiac arrhythmias: Secondary | ICD-10-CM | POA: Diagnosis not present

## 2020-12-04 DIAGNOSIS — I679 Cerebrovascular disease, unspecified: Secondary | ICD-10-CM | POA: Diagnosis not present

## 2020-12-04 DIAGNOSIS — G720 Drug-induced myopathy: Secondary | ICD-10-CM | POA: Diagnosis not present

## 2020-12-04 DIAGNOSIS — J309 Allergic rhinitis, unspecified: Secondary | ICD-10-CM | POA: Diagnosis not present

## 2020-12-04 DIAGNOSIS — I10 Essential (primary) hypertension: Secondary | ICD-10-CM | POA: Diagnosis not present

## 2020-12-04 DIAGNOSIS — T466X5A Adverse effect of antihyperlipidemic and antiarteriosclerotic drugs, initial encounter: Secondary | ICD-10-CM | POA: Diagnosis not present

## 2020-12-04 DIAGNOSIS — N959 Unspecified menopausal and perimenopausal disorder: Secondary | ICD-10-CM | POA: Diagnosis not present

## 2020-12-08 DIAGNOSIS — I10 Essential (primary) hypertension: Secondary | ICD-10-CM | POA: Diagnosis not present

## 2020-12-08 DIAGNOSIS — Z6821 Body mass index (BMI) 21.0-21.9, adult: Secondary | ICD-10-CM | POA: Diagnosis not present

## 2020-12-08 DIAGNOSIS — N390 Urinary tract infection, site not specified: Secondary | ICD-10-CM | POA: Diagnosis not present

## 2020-12-08 DIAGNOSIS — K219 Gastro-esophageal reflux disease without esophagitis: Secondary | ICD-10-CM | POA: Diagnosis not present

## 2020-12-27 DIAGNOSIS — M25552 Pain in left hip: Secondary | ICD-10-CM | POA: Diagnosis not present

## 2020-12-27 DIAGNOSIS — S32599A Other specified fracture of unspecified pubis, initial encounter for closed fracture: Secondary | ICD-10-CM

## 2020-12-27 DIAGNOSIS — S32592A Other specified fracture of left pubis, initial encounter for closed fracture: Secondary | ICD-10-CM | POA: Diagnosis not present

## 2020-12-27 HISTORY — DX: Other specified fracture of unspecified pubis, initial encounter for closed fracture: S32.599A

## 2020-12-27 HISTORY — DX: Pain in left hip: M25.552

## 2021-01-08 DIAGNOSIS — M810 Age-related osteoporosis without current pathological fracture: Secondary | ICD-10-CM | POA: Diagnosis not present

## 2021-01-17 DIAGNOSIS — M25552 Pain in left hip: Secondary | ICD-10-CM | POA: Diagnosis not present

## 2021-01-17 DIAGNOSIS — S32592A Other specified fracture of left pubis, initial encounter for closed fracture: Secondary | ICD-10-CM | POA: Diagnosis not present

## 2021-01-18 DIAGNOSIS — J309 Allergic rhinitis, unspecified: Secondary | ICD-10-CM | POA: Diagnosis not present

## 2021-01-18 DIAGNOSIS — I679 Cerebrovascular disease, unspecified: Secondary | ICD-10-CM | POA: Diagnosis not present

## 2021-01-18 DIAGNOSIS — E7800 Pure hypercholesterolemia, unspecified: Secondary | ICD-10-CM | POA: Diagnosis not present

## 2021-01-18 DIAGNOSIS — N959 Unspecified menopausal and perimenopausal disorder: Secondary | ICD-10-CM | POA: Diagnosis not present

## 2021-01-18 DIAGNOSIS — I10 Essential (primary) hypertension: Secondary | ICD-10-CM | POA: Diagnosis not present

## 2021-01-18 DIAGNOSIS — I498 Other specified cardiac arrhythmias: Secondary | ICD-10-CM | POA: Diagnosis not present

## 2021-01-18 DIAGNOSIS — T466X5A Adverse effect of antihyperlipidemic and antiarteriosclerotic drugs, initial encounter: Secondary | ICD-10-CM | POA: Diagnosis not present

## 2021-01-18 DIAGNOSIS — N390 Urinary tract infection, site not specified: Secondary | ICD-10-CM | POA: Diagnosis not present

## 2021-01-18 DIAGNOSIS — G720 Drug-induced myopathy: Secondary | ICD-10-CM | POA: Diagnosis not present

## 2021-01-23 DIAGNOSIS — D485 Neoplasm of uncertain behavior of skin: Secondary | ICD-10-CM | POA: Diagnosis not present

## 2021-02-02 DIAGNOSIS — C44729 Squamous cell carcinoma of skin of left lower limb, including hip: Secondary | ICD-10-CM | POA: Diagnosis not present

## 2021-03-06 DIAGNOSIS — I679 Cerebrovascular disease, unspecified: Secondary | ICD-10-CM | POA: Diagnosis not present

## 2021-03-06 DIAGNOSIS — T466X5A Adverse effect of antihyperlipidemic and antiarteriosclerotic drugs, initial encounter: Secondary | ICD-10-CM | POA: Diagnosis not present

## 2021-03-06 DIAGNOSIS — J309 Allergic rhinitis, unspecified: Secondary | ICD-10-CM | POA: Diagnosis not present

## 2021-03-06 DIAGNOSIS — Z139 Encounter for screening, unspecified: Secondary | ICD-10-CM | POA: Diagnosis not present

## 2021-03-06 DIAGNOSIS — E7800 Pure hypercholesterolemia, unspecified: Secondary | ICD-10-CM | POA: Diagnosis not present

## 2021-03-06 DIAGNOSIS — N959 Unspecified menopausal and perimenopausal disorder: Secondary | ICD-10-CM | POA: Diagnosis not present

## 2021-03-06 DIAGNOSIS — I10 Essential (primary) hypertension: Secondary | ICD-10-CM | POA: Diagnosis not present

## 2021-03-06 DIAGNOSIS — I498 Other specified cardiac arrhythmias: Secondary | ICD-10-CM | POA: Diagnosis not present

## 2021-03-06 DIAGNOSIS — G720 Drug-induced myopathy: Secondary | ICD-10-CM | POA: Diagnosis not present

## 2021-04-23 DIAGNOSIS — H6123 Impacted cerumen, bilateral: Secondary | ICD-10-CM | POA: Diagnosis not present

## 2021-04-23 DIAGNOSIS — H61303 Acquired stenosis of external ear canal, unspecified, bilateral: Secondary | ICD-10-CM | POA: Diagnosis not present

## 2021-05-02 DIAGNOSIS — I679 Cerebrovascular disease, unspecified: Secondary | ICD-10-CM | POA: Diagnosis not present

## 2021-05-02 DIAGNOSIS — G720 Drug-induced myopathy: Secondary | ICD-10-CM | POA: Diagnosis not present

## 2021-05-02 DIAGNOSIS — J309 Allergic rhinitis, unspecified: Secondary | ICD-10-CM | POA: Diagnosis not present

## 2021-05-02 DIAGNOSIS — R42 Dizziness and giddiness: Secondary | ICD-10-CM | POA: Diagnosis not present

## 2021-05-02 DIAGNOSIS — I10 Essential (primary) hypertension: Secondary | ICD-10-CM | POA: Diagnosis not present

## 2021-05-02 DIAGNOSIS — I498 Other specified cardiac arrhythmias: Secondary | ICD-10-CM | POA: Diagnosis not present

## 2021-05-02 DIAGNOSIS — H6691 Otitis media, unspecified, right ear: Secondary | ICD-10-CM | POA: Diagnosis not present

## 2021-05-02 DIAGNOSIS — T466X5A Adverse effect of antihyperlipidemic and antiarteriosclerotic drugs, initial encounter: Secondary | ICD-10-CM | POA: Diagnosis not present

## 2021-05-02 DIAGNOSIS — E7800 Pure hypercholesterolemia, unspecified: Secondary | ICD-10-CM | POA: Diagnosis not present

## 2021-05-28 DIAGNOSIS — H9209 Otalgia, unspecified ear: Secondary | ICD-10-CM | POA: Diagnosis not present

## 2021-05-28 DIAGNOSIS — R42 Dizziness and giddiness: Secondary | ICD-10-CM | POA: Diagnosis not present

## 2021-05-28 DIAGNOSIS — H61303 Acquired stenosis of external ear canal, unspecified, bilateral: Secondary | ICD-10-CM | POA: Diagnosis not present

## 2021-05-28 DIAGNOSIS — H8110 Benign paroxysmal vertigo, unspecified ear: Secondary | ICD-10-CM | POA: Diagnosis not present

## 2021-06-04 DIAGNOSIS — T466X5A Adverse effect of antihyperlipidemic and antiarteriosclerotic drugs, initial encounter: Secondary | ICD-10-CM | POA: Diagnosis not present

## 2021-06-04 DIAGNOSIS — G720 Drug-induced myopathy: Secondary | ICD-10-CM | POA: Diagnosis not present

## 2021-06-04 DIAGNOSIS — I498 Other specified cardiac arrhythmias: Secondary | ICD-10-CM | POA: Diagnosis not present

## 2021-06-04 DIAGNOSIS — E7800 Pure hypercholesterolemia, unspecified: Secondary | ICD-10-CM | POA: Diagnosis not present

## 2021-06-04 DIAGNOSIS — I10 Essential (primary) hypertension: Secondary | ICD-10-CM | POA: Diagnosis not present

## 2021-06-04 DIAGNOSIS — N959 Unspecified menopausal and perimenopausal disorder: Secondary | ICD-10-CM | POA: Diagnosis not present

## 2021-06-04 DIAGNOSIS — J309 Allergic rhinitis, unspecified: Secondary | ICD-10-CM | POA: Diagnosis not present

## 2021-06-04 DIAGNOSIS — G43109 Migraine with aura, not intractable, without status migrainosus: Secondary | ICD-10-CM | POA: Diagnosis not present

## 2021-06-04 DIAGNOSIS — I679 Cerebrovascular disease, unspecified: Secondary | ICD-10-CM | POA: Diagnosis not present

## 2021-06-09 DIAGNOSIS — S30861A Insect bite (nonvenomous) of abdominal wall, initial encounter: Secondary | ICD-10-CM | POA: Diagnosis not present

## 2021-06-09 DIAGNOSIS — R21 Rash and other nonspecific skin eruption: Secondary | ICD-10-CM | POA: Diagnosis not present

## 2021-06-09 DIAGNOSIS — W57XXXA Bitten or stung by nonvenomous insect and other nonvenomous arthropods, initial encounter: Secondary | ICD-10-CM | POA: Diagnosis not present

## 2021-06-11 DIAGNOSIS — M545 Low back pain, unspecified: Secondary | ICD-10-CM | POA: Diagnosis not present

## 2021-06-11 DIAGNOSIS — I10 Essential (primary) hypertension: Secondary | ICD-10-CM | POA: Diagnosis not present

## 2021-06-11 DIAGNOSIS — G43109 Migraine with aura, not intractable, without status migrainosus: Secondary | ICD-10-CM | POA: Diagnosis not present

## 2021-06-11 DIAGNOSIS — E7800 Pure hypercholesterolemia, unspecified: Secondary | ICD-10-CM | POA: Diagnosis not present

## 2021-06-11 DIAGNOSIS — J309 Allergic rhinitis, unspecified: Secondary | ICD-10-CM | POA: Diagnosis not present

## 2021-06-11 DIAGNOSIS — G720 Drug-induced myopathy: Secondary | ICD-10-CM | POA: Diagnosis not present

## 2021-06-11 DIAGNOSIS — I679 Cerebrovascular disease, unspecified: Secondary | ICD-10-CM | POA: Diagnosis not present

## 2021-06-11 DIAGNOSIS — N959 Unspecified menopausal and perimenopausal disorder: Secondary | ICD-10-CM | POA: Diagnosis not present

## 2021-06-11 DIAGNOSIS — I498 Other specified cardiac arrhythmias: Secondary | ICD-10-CM | POA: Diagnosis not present

## 2021-06-12 DIAGNOSIS — Z1231 Encounter for screening mammogram for malignant neoplasm of breast: Secondary | ICD-10-CM | POA: Diagnosis not present

## 2021-06-25 ENCOUNTER — Encounter: Payer: Self-pay | Admitting: Cardiology

## 2021-06-25 ENCOUNTER — Ambulatory Visit: Payer: Medicare PPO | Admitting: Cardiology

## 2021-06-25 VITALS — BP 120/72 | HR 64 | Ht 65.0 in | Wt 138.0 lb

## 2021-06-25 DIAGNOSIS — I491 Atrial premature depolarization: Secondary | ICD-10-CM

## 2021-06-25 DIAGNOSIS — I471 Supraventricular tachycardia: Secondary | ICD-10-CM

## 2021-06-25 MED ORDER — FLECAINIDE ACETATE 50 MG PO TABS: 50.0000 mg | ORAL_TABLET | Freq: Two times a day (BID) | ORAL | 3 refills | Status: DC

## 2021-06-25 NOTE — Progress Notes (Signed)
Electrophysiology Office Note   Date:  06/25/2021   ID:  Olivia Blackburn, DOB 04/07/1945, MRN [id number]  PCP:  Simone CuriaLee, Keung, MD  Cardiologist:  Dulce SellarMunley Primary Electrophysiologist:  Nayla Dias Jorja LoaMartin Liliyana Thobe, MD    Chief Complaint: SVT   History of Present Illness: Olivia Blackburn is a 76 y.o. female who is being seen today for the evaluation of SVT at the request of Simone CuriaLee, Keung, MD. Presenting today for electrophysiology evaluation.  She has a history significant for frequent PACs, PVCs, hypertension, hyperlipidemia, prior stroke.  She had been having episodes of marked weakness.  Heart rates are within the 30s at home.  She wore a cardiac monitor that showed multiple PACs.  She was started on metoprolol for her PACs.  Today, denies symptoms of chest pain, orthopnea, PND, lower extremity edema, claudication, dizziness, presyncope, syncope, bleeding, or neurologic sequela. The patient is tolerating medications without difficulties.  Since being seen, she is overall done well.  Despite this in the last 2 weeks she has developed significant shortness of breath and fatigue.  Recordings by her Apple Watch show a higher burden of PACs.  She states that she feels them on a daily basis.  She also has intermittent palpitations.  This was worse last week.  This week she is felt much improved.   Past Medical History:  Diagnosis Date   Allergic rhinitis 07/04/2017   APC (atrial premature contractions) 07/07/2017   Cerebrovascular accident (CVA) (HCC) 07/04/2017   Closed fracture of base of fifth metatarsal bone 05/16/2017   Frequent PVCs 08/13/2017   GERD (gastroesophageal reflux disease) 07/04/2017   Hyperlipidemia 07/04/2017   Hypertension    Hyponatremia 07/04/2017   Migraine with prolonged aura, not intractable 07/04/2017   Mitral regurgitation 08/13/2017   Osteoporosis 07/04/2017   Palpitations 07/04/2017   Past Surgical History:  Procedure Laterality Date   ABDOMINAL HYSTERECTOMY     CYSTOCELE REPAIR      TONSILLECTOMY       Current Outpatient Medications  Medication Sig Dispense Refill   aspirin EC 81 MG tablet Take 81 mg by mouth daily.      Calcium-Vitamin D-Vitamin K 500-500-40 MG-UNT-MCG CHEW Chew 1 tablet by mouth 2 (two) times daily.      conjugated estrogens (PREMARIN) vaginal cream Place 1 Applicatorful vaginally 2 (two) times a week.      denosumab (PROLIA) 60 MG/ML SOLN injection Inject 60 mg into the skin every 6 (six) months. Administer in upper arm, thigh, or abdomen     lisinopril (PRINIVIL,ZESTRIL) 20 MG tablet Take 20 mg by mouth daily.     loratadine (CLARITIN) 10 MG tablet Take 1 tablet by mouth daily.     metoprolol succinate (TOPROL-XL) 50 MG 24 hr tablet Take 1 tablet (50 mg total) by mouth daily. Take with or immediately following a meal. 90 tablet 3   nitroGLYCERIN (NITROSTAT) 0.4 MG SL tablet Place 1 tablet (0.4 mg total) under the tongue every 5 (five) minutes as needed for chest pain. If pain not relieved by 3rd dose, call 911. 30 tablet 0   No current facility-administered medications for this visit.    Allergies:   Ibuprofen and Azithromycin   Social History:  The patient  reports that she has never smoked. She has never used smokeless tobacco. She reports that she does not currently use alcohol. She reports that she does not currently use drugs.   Family History:  The patient's family history includes Bone cancer in her maternal grandfather;  Cerebrovascular Disease in her father; Diabetes in her brother and sister; Heart attack in her brother; Heart disease in her brother and father; Hyperlipidemia in her father, paternal grandfather, and paternal grandmother; Hypertension in her father; Ovarian cancer in her mother; Pancreatic cancer in her sister; Stroke in her brother and father.   ROS:  Please see the history of present illness.   Otherwise, review of systems is positive for none.   All other systems are reviewed and negative.   PHYSICAL EXAM: VS:  BP  120/72   Pulse 64   Ht 5\' 5"  (1.651 m)   Wt 138 lb (62.6 kg)   SpO2 91% Comment: pt has on thick nail polish  BMI 22.96 kg/m  , BMI Body mass index is 22.96 kg/m. GEN: Well nourished, well developed, in no acute distress  HEENT: normal  Neck: no JVD, carotid bruits, or masses Cardiac: RRR; no murmurs, rubs, or gallops,no edema  Respiratory:  clear to auscultation bilaterally, normal work of breathing GI: soft, nontender, nondistended, + BS MS: no deformity or atrophy  Skin: warm and dry Neuro:  Strength and sensation are intact Psych: euthymic mood, full affect  EKG:  EKG is ordered today. Personal review of the ekg ordered shows sinus rhythm, PACs  Recent Labs: No results found for requested labs within last 8760 hours.    Lipid Panel  No results found for: CHOL, TRIG, HDL, CHOLHDL, VLDL, LDLCALC, LDLDIRECT   Wt Readings from Last 3 Encounters:  09/18/20 132 lb (59.9 kg)  06/28/20 133 lb 12.8 oz (60.7 kg)  06/06/20 130 lb (59 kg)      Other studies Reviewed: Additional studies/ records that were reviewed today include: TTE 03/08/19  Review of the above records today demonstrates:   1. Left ventricular ejection fraction, by estimation, is 55%. The left  ventricle has low normal function. The left ventricle has no regional wall  motion abnormalities. Left ventricular diastolic parameters are consistent  with Grade II diastolic  dysfunction (pseudonormalization).   2. Right ventricular systolic function is normal. The right ventricular  size is normal. There is normal pulmonary artery systolic pressure. The  estimated right ventricular systolic pressure is 27.0 mmHg.   3. Left atrial size was mild to moderately dilated.   4. The mitral valve is normal in structure and function. Moderate central  mitral valve regurgitation, appears functional. No evidence of mitral  stenosis.   5. The aortic valve is tricuspid. Aortic valve regurgitation is not  visualized. No aortic  stenosis is present.   6. The inferior vena cava is normal in size with greater than 50%  respiratory variability, suggesting right atrial pressure of 3 mmHg.   Monitor 05/30/2020 personally reviewed frequent supraventricular ectopy with frequent brief episodes of SVT.   ASSESSMENT AND PLAN:  1.  PACs: Has a symptoms and fatigue of shortness of breath.  Burden as high as 30%.  She is currently on metoprolol with improved symptoms.  Unfortunately her PACs have returned.  She is symptomatic with fatigue and shortness of breath.  We Aslin Farinas start her on flecainide 50 mg twice daily.   Current medicines are reviewed at length with the patient today.   The patient does not have concerns regarding her medicines.  The following changes were made today: Start flecainide  Labs/ tests ordered today include:  No orders of the defined types were placed in this encounter.     Disposition:   FU with Adaleena Mooers 3 months  Signed,  Hydia Copelin Jorja Loa, MD  06/25/2021 2:12 PM     [facility] [address] [place] [address] [phone] (office) [phone] (fax)

## 2021-06-25 NOTE — Patient Instructions (Signed)
Medication Instructions:  Your physician has recommended you make the following change in your medication:  START Flecainide 50 mg twice daily  *If you need a refill on your cardiac medications before your next appointment, please call your pharmacy*   Lab Work: None ordered   Testing/Procedures: None ordered   Follow-Up: At [facility], you and your health needs are our priority.  As part of our continuing mission to provide you with exceptional heart care, we have created designated Provider Care Teams.  These Care Teams include your primary Cardiologist (physician) and Advanced Practice Providers (APPs -  Physician Assistants and Nurse Practitioners) who all work together to provide you with the care you need, when you need it.  Your next appointment:   3 month(s)  The format for your next appointment:   In Person  Provider:   Loman Brooklyn, MD    Thank you for choosing [facility]!!   Dory Horn, RN [phone]  Other Instructions   Important Information About Sugar      Flecainide Tablets What is this medication? FLECAINIDE (FLEK a nide) prevents and treats a fast or irregular heartbeat (arrhythmia). It is often used to treat a type of arrhythmia known as AFib (atrial fibrillation). It works by slowing down overactive electric signals in the heart, which stabilizes your heart rhythm. It belongs to a group of medications called antiarrhythmics. This medicine may be used for other purposes; ask your health care provider or pharmacist if you have questions. COMMON BRAND NAME(S): Tambocor What should I tell my care team before I take this medication? They need to know if you have any of these conditions: Abnormal levels of potassium in the blood Heart disease including heart rhythm and heart rate problems Kidney or liver disease Recent heart attack An unusual or allergic reaction to flecainide, local anesthetics, other medications, foods, dyes, or  preservatives Pregnant or trying to get pregnant Breast-feeding How should I use this medication? Take this medication by mouth with a glass of water. Follow the directions on the prescription label. You can take this medication with or without food. Take your doses at regular intervals. Do not take your medication more often than directed. Do not stop taking this medication suddenly. This may cause serious, heart-related side effects. If your care team wants you to stop the medication, the dose may be slowly lowered over time to avoid any side effects. Talk to your care team regarding the use of this medication in children. While this medication may be prescribed for children as young as 1 year of age for selected conditions, precautions do apply. Overdosage: If you think you have taken too much of this medicine contact a poison control center or emergency room at once. NOTE: This medicine is only for you. Do not share this medicine with others. What if I miss a dose? If you miss a dose, take it as soon as you can. If it is almost time for your next dose, take only that dose. Do not take double or extra doses. What may interact with this medication? Do not take this medication with any of the following: Amoxapine Arsenic trioxide Certain antibiotics like clarithromycin, erythromycin, gatifloxacin, gemifloxacin, levofloxacin, moxifloxacin, sparfloxacin, or troleandomycin Certain antidepressants called tricyclic antidepressants like amitriptyline, imipramine, or nortriptyline Certain medications to control heart rhythm like disopyramide, encainide, moricizine, procainamide, propafenone, and quinidine Cisapride Delavirdine Droperidol Haloperidol [place]orn Imatinib Levomethadyl Maprotiline Medications for malaria like chloroquine and halofantrine Pentamidine Phenothiazines like chlorpromazine, mesoridazine, prochlorperazine,  thioridazine  Pimozide Quinine Ranolazine Ritonavir Sertindole This medication may also interact with the following: Cimetidine Dofetilide Medications for angina or high blood pressure Medications to control heart rhythm like amiodarone and digoxin Ziprasidone This list may not describe all possible interactions. Give your health care provider a list of all the medicines, herbs, non-prescription drugs, or dietary supplements you use. Also tell them if you smoke, drink alcohol, or use illegal drugs. Some items may interact with your medicine. What should I watch for while using this medication? Visit your care team for regular checks on your progress. Because your condition and the use of this medication carries some risk, it is a good idea to carry an identification card, necklace or bracelet with details of your condition, medications, and care team. Check your blood pressure and pulse rate regularly. Ask your care team what your blood pressure and pulse rate should be, and when you should contact them. Your care team also may schedule regular blood tests and electrocardiograms to check your progress. You may get drowsy or dizzy. Do not drive, use machinery, or do anything that needs mental alertness until you know how this medication affects you. Do not stand or sit up quickly, especially if you are an older patient. This reduces the risk of dizzy or fainting spells. Alcohol can make you more dizzy, increase flushing and rapid heartbeats. Avoid alcoholic drinks. What side effects may I notice from receiving this medication? Side effects that you should report to your care team as soon as possible: Allergic reactions--skin rash, itching, hives, swelling of the face, lips, tongue, or throat Heart failure--shortness of breath, swelling of the ankles, feet, or hands, sudden weight gain, unusual weakness or fatigue Heart rhythm changes--fast or irregular heartbeat, dizziness, feeling faint or  lightheaded, chest pain, trouble breathing Liver injury--right upper belly pain, loss of appetite, nausea, light-colored stool, dark yellow or brown urine, yellowing skin or eyes, unusual weakness or fatigue Side effects that usually do not require medical attention (report to your care team if they continue or are bothersome): Blurry vision Constipation Dizziness Fatigue Headache Nausea Tremors or shaking This list may not describe all possible side effects. Call your doctor for medical advice about side effects. You may report side effects to FDA at 1-800-FDA-1088. Where should I keep my medication? Keep out of the reach of children and pets. Store at room temperature between 15 and 30 degrees C (59 and 86 degrees F). Protect from light. Keep container tightly closed. Throw away any unused medication after the expiration date. NOTE: This sheet is a summary. It may not cover all possible information. If you have questions about this medicine, talk to your doctor, pharmacist, or health care provider.  2023 Elsevier/Gold Standard (2020-03-03 00:00:00)

## 2021-06-26 DIAGNOSIS — D2239 Melanocytic nevi of other parts of face: Secondary | ICD-10-CM | POA: Diagnosis not present

## 2021-06-26 DIAGNOSIS — D225 Melanocytic nevi of trunk: Secondary | ICD-10-CM | POA: Diagnosis not present

## 2021-06-26 DIAGNOSIS — L821 Other seborrheic keratosis: Secondary | ICD-10-CM | POA: Diagnosis not present

## 2021-06-26 DIAGNOSIS — L814 Other melanin hyperpigmentation: Secondary | ICD-10-CM | POA: Diagnosis not present

## 2021-06-30 NOTE — Progress Notes (Unsigned)
Cardiology Office Note:    Date:  07/02/2021   ID:  Olivia Blackburn, DOB [birth year], MRN [id number]  PCP:  Simone Curia, MD  Cardiologist:  Norman Herrlich, MD    Referring MD: Simone Curia, MD    ASSESSMENT:    1. Premature ventricular contractions (PVCs) (VPCs)   2. SVT (supraventricular tachycardia) (HCC)   3. Non-rheumatic mitral regurgitation   4. Primary hypertension   5. Familial hypercholesterolemia    PLAN:    In order of problems listed above:  Olivia Blackburn is doing well with her PVCs good suppression with flecainide no evidence of toxicity and continue her beta-blocker flecainide I will ask a PVC to draw level with her upcoming labs Stable mitral regurgitation BP well controlled continue treatment ACE inhibitor She has severe hyperlipidemia approaching familial levels likely require a second drug either PCSK9 inhibitor or bempedoic acid await labs   Next appointment: 6 months    Medication Adjustments/Labs and Tests Ordered: Current medicines are reviewed at length with the patient today.  Concerns regarding medicines are outlined above.  No orders of the defined types were placed in this encounter.  No orders of the defined types were placed in this encounter.   Chief Complaint  Patient presents with   pvc's   Hypertension   Mitral Regurgitation    History of Present Illness:    Olivia Blackburn is a 76 y.o. female with a hx of frequent atrial arrhythmia symptomatic PVCs hypertension hyperlipidemia previous stroke mild mitral regurgitation very mild enlargement ascending aorta 37 mm last seen by me 06/28/2020.  Echocardiogram performed 06/07/2020 showed moderate LVH EF 55 to 60% normal right ventricular size function and pulmonary artery pressure and there is mild mitral regurgitation noted.  Compliance with diet, lifestyle and medications: Yes  She tolerates flecainide she has an Apple Watch frequently screened her heart rhythm normal longer has PVCs No side effect of  headache. Overall feels better no weakness lightheadedness edema shortness of breath chest pain syncope. Recently was initiated on Zetia for severe dyslipidemia LDL of 175.  Other labs showed triglyceride of 150 cholesterol 243 hemoglobin 13.8 creatinine 0.90 potassium 4.3 She will have repeat labs soon and then ask her to also do a flecainide level at that time  She was seen by EP in August 2022 for a high APC burden upwards of 30% with fatigue and shortness of breath.  She was continued on beta-blocker with symptomatic improvement.   Past Medical History:  Diagnosis Date   Allergic rhinitis 07/04/2017   APC (atrial premature contractions) 07/07/2017   Cerebrovascular accident (CVA) (HCC) 07/04/2017   Closed fracture of base of fifth metatarsal bone 05/16/2017   Frequent PVCs 08/13/2017   GERD (gastroesophageal reflux disease) 07/04/2017   Hyperlipidemia 07/04/2017   Hypertension    Hyponatremia 07/04/2017   Migraine with prolonged aura, not intractable 07/04/2017   Mitral regurgitation 08/13/2017   Osteoporosis 07/04/2017   Palpitations 07/04/2017    Past Surgical History:  Procedure Laterality Date   ABDOMINAL HYSTERECTOMY     CYSTOCELE REPAIR     TONSILLECTOMY      Current Medications: Current Meds  Medication Sig   aspirin EC 81 MG tablet Take 81 mg by mouth daily.    Calcium-Vitamin D-Vitamin K 500-500-40 MG-UNT-MCG CHEW Chew 1 tablet by mouth 2 (two) times daily.    conjugated estrogens (PREMARIN) vaginal cream Place 1 Applicatorful vaginally 2 (two) times a week.    denosumab (PROLIA) 60 MG/ML SOLN injection Inject 60  mg into the skin every 6 (six) months. Administer in upper arm, thigh, or abdomen   ezetimibe (ZETIA) 10 MG tablet Take 10 mg by mouth daily.   flecainide (TAMBOCOR) 50 MG tablet Take 1 tablet (50 mg total) by mouth 2 (two) times daily.   lisinopril (PRINIVIL,ZESTRIL) 20 MG tablet Take 20 mg by mouth daily.   loratadine (CLARITIN) 10 MG tablet Take 1 tablet by  mouth daily.   metoprolol succinate (TOPROL-XL) 50 MG 24 hr tablet Take 1 tablet (50 mg total) by mouth daily. Take with or immediately following a meal.   nitroGLYCERIN (NITROSTAT) 0.4 MG SL tablet Place 1 tablet (0.4 mg total) under the tongue every 5 (five) minutes as needed for chest pain. If pain not relieved by 3rd dose, call 911.     Allergies:   Ibuprofen and Azithromycin   Social History   Socioeconomic History   Marital status: Widowed    Spouse name: Not on file   Number of children: Not on file   Years of education: Not on file   Highest education level: Not on file  Occupational History   Not on file  Tobacco Use   Smoking status: Never   Smokeless tobacco: Never  Vaping Use   Vaping Use: Never used  Substance and Sexual Activity   Alcohol use: Not Currently   Drug use: Not Currently   Sexual activity: Not on file  Other Topics Concern   Not on file  Social History Narrative   Not on file   Social Determinants of Health   Financial Resource Strain: Not on file  Food Insecurity: Not on file  Transportation Needs: Not on file  Physical Activity: Not on file  Stress: Not on file  Social Connections: Not on file     Family History: The patient's family history includes Bone cancer in her maternal grandfather; Cerebrovascular Disease in her father; Diabetes in her brother and sister; Heart attack in her brother; Heart disease in her brother and father; Hyperlipidemia in her father, paternal grandfather, and paternal grandmother; Hypertension in her father; Ovarian cancer in her mother; Pancreatic cancer in her sister; Stroke in her brother and father. ROS:   Please see the history of present illness.    All other systems reviewed and are negative.  EKGs/Labs/Other Studies Reviewed:    The following studies were reviewed today:  EKG:  EKG ordered today and personally reviewed.  The ekg ordered today demonstrates sinus rhythm normal EKG no findings of 1C  antiarrhythmic drug toxicity   Physical Exam:    VS:  BP 105/62 (BP Location: Right Arm)   Pulse 60   Ht 5\' 5"  (1.651 m)   Wt 137 lb 12.8 oz (62.5 kg)   SpO2 98%   BMI 22.93 kg/m     Wt Readings from Last 3 Encounters:  07/02/21 137 lb 12.8 oz (62.5 kg)  06/25/21 138 lb (62.6 kg)  09/18/20 132 lb (59.9 kg)     GEN:  Well nourished, well developed in no acute distress HEENT: Normal NECK: No JVD; No carotid bruits LYMPHATICS: No lymphadenopathy CARDIAC: RRR, no murmurs, rubs, gallops RESPIRATORY:  Clear to auscultation without rales, wheezing or rhonchi  ABDOMEN: Soft, non-tender, non-distended MUSCULOSKELETAL:  No edema; No deformity  SKIN: Warm and dry NEUROLOGIC:  Alert and oriented x 3 PSYCHIATRIC:  Normal affect    Signed, 09/20/20, MD  07/02/2021 10:45 AM    [facility]

## 2021-07-02 ENCOUNTER — Encounter: Payer: Self-pay | Admitting: Cardiology

## 2021-07-02 ENCOUNTER — Ambulatory Visit: Payer: Medicare PPO | Admitting: Cardiology

## 2021-07-02 VITALS — BP 105/62 | HR 60 | Ht 65.0 in | Wt 137.8 lb

## 2021-07-02 DIAGNOSIS — I493 Ventricular premature depolarization: Secondary | ICD-10-CM

## 2021-07-02 DIAGNOSIS — E7801 Familial hypercholesterolemia: Secondary | ICD-10-CM | POA: Diagnosis not present

## 2021-07-02 DIAGNOSIS — N3001 Acute cystitis with hematuria: Secondary | ICD-10-CM | POA: Diagnosis not present

## 2021-07-02 DIAGNOSIS — N3091 Cystitis, unspecified with hematuria: Secondary | ICD-10-CM | POA: Diagnosis not present

## 2021-07-02 DIAGNOSIS — I10 Essential (primary) hypertension: Secondary | ICD-10-CM

## 2021-07-02 DIAGNOSIS — I471 Supraventricular tachycardia: Secondary | ICD-10-CM | POA: Diagnosis not present

## 2021-07-02 DIAGNOSIS — I340 Nonrheumatic mitral (valve) insufficiency: Secondary | ICD-10-CM | POA: Diagnosis not present

## 2021-07-02 NOTE — Patient Instructions (Addendum)
Medication Instructions:  Your physician recommends that you continue on your current medications as directed. Please refer to the Current Medication list given to you today.  *If you need a refill on your cardiac medications before your next appointment, please call your pharmacy*   Lab Work: None  If you have labs (blood work) drawn today and your tests are completely normal, you will receive your results only by: MyChart Message (if you have MyChart) OR A paper copy in the mail If you have any lab test that is abnormal or we need to change your treatment, we will call you to review the results.   Testing/Procedures: None   Follow-Up: At [facility], you and your health needs are our priority.  As part of our continuing mission to provide you with exceptional heart care, we have created designated Provider Care Teams.  These Care Teams include your primary Cardiologist (physician) and Advanced Practice Providers (APPs -  Physician Assistants and Nurse Practitioners) who all work together to provide you with the care you need, when you need it.  We recommend signing up for the patient portal called "MyChart".  Sign up information is provided on this After Visit Summary.  MyChart is used to connect with patients for Virtual Visits (Telemedicine).  Patients are able to view lab/test results, encounter notes, upcoming appointments, etc.  Non-urgent messages can be sent to your provider as well.   To learn more about what you can do with MyChart, go to [email or web address].    Your next appointment:   9 month(s)  The format for your next appointment:   In Person  Provider:   Norman Herrlich, MD    Other Instructions Do a flecanide level with Dr. Nedra Hai  Important Information About Sugar         1. Avoid all over-the-counter antihistamines except Claritin/Loratadine and Zyrtec/Cetrizine. 2. Avoid all combination including cold sinus allergies flu decongestant and sleep  medications 3. You can use Robitussin DM Mucinex and Mucinex DM for cough. 4. can use Tylenol aspirin ibuprofen and naproxen but no combinations such as sleep or sinus.

## 2021-07-04 DIAGNOSIS — M25551 Pain in right hip: Secondary | ICD-10-CM | POA: Diagnosis not present

## 2021-07-04 DIAGNOSIS — M545 Low back pain, unspecified: Secondary | ICD-10-CM | POA: Diagnosis not present

## 2021-07-04 HISTORY — DX: Low back pain, unspecified: M54.50

## 2021-07-09 DIAGNOSIS — I498 Other specified cardiac arrhythmias: Secondary | ICD-10-CM | POA: Diagnosis not present

## 2021-07-09 DIAGNOSIS — G43109 Migraine with aura, not intractable, without status migrainosus: Secondary | ICD-10-CM | POA: Diagnosis not present

## 2021-07-09 DIAGNOSIS — I679 Cerebrovascular disease, unspecified: Secondary | ICD-10-CM | POA: Diagnosis not present

## 2021-07-09 DIAGNOSIS — N959 Unspecified menopausal and perimenopausal disorder: Secondary | ICD-10-CM | POA: Diagnosis not present

## 2021-07-09 DIAGNOSIS — E7800 Pure hypercholesterolemia, unspecified: Secondary | ICD-10-CM | POA: Diagnosis not present

## 2021-07-09 DIAGNOSIS — G720 Drug-induced myopathy: Secondary | ICD-10-CM | POA: Diagnosis not present

## 2021-07-09 DIAGNOSIS — J309 Allergic rhinitis, unspecified: Secondary | ICD-10-CM | POA: Diagnosis not present

## 2021-07-09 DIAGNOSIS — T466X5A Adverse effect of antihyperlipidemic and antiarteriosclerotic drugs, initial encounter: Secondary | ICD-10-CM | POA: Diagnosis not present

## 2021-07-09 DIAGNOSIS — I10 Essential (primary) hypertension: Secondary | ICD-10-CM | POA: Diagnosis not present

## 2021-07-12 DIAGNOSIS — M810 Age-related osteoporosis without current pathological fracture: Secondary | ICD-10-CM | POA: Diagnosis not present

## 2021-07-31 DIAGNOSIS — M810 Age-related osteoporosis without current pathological fracture: Secondary | ICD-10-CM | POA: Diagnosis not present

## 2021-07-31 DIAGNOSIS — E785 Hyperlipidemia, unspecified: Secondary | ICD-10-CM | POA: Diagnosis not present

## 2021-07-31 DIAGNOSIS — Z823 Family history of stroke: Secondary | ICD-10-CM | POA: Diagnosis not present

## 2021-07-31 DIAGNOSIS — M8580 Other specified disorders of bone density and structure, unspecified site: Secondary | ICD-10-CM | POA: Diagnosis not present

## 2021-07-31 DIAGNOSIS — I4891 Unspecified atrial fibrillation: Secondary | ICD-10-CM | POA: Diagnosis not present

## 2021-07-31 DIAGNOSIS — L659 Nonscarring hair loss, unspecified: Secondary | ICD-10-CM | POA: Diagnosis not present

## 2021-07-31 DIAGNOSIS — Z809 Family history of malignant neoplasm, unspecified: Secondary | ICD-10-CM | POA: Diagnosis not present

## 2021-07-31 DIAGNOSIS — I10 Essential (primary) hypertension: Secondary | ICD-10-CM | POA: Diagnosis not present

## 2021-07-31 DIAGNOSIS — Z7982 Long term (current) use of aspirin: Secondary | ICD-10-CM | POA: Diagnosis not present

## 2021-08-09 DIAGNOSIS — M5416 Radiculopathy, lumbar region: Secondary | ICD-10-CM

## 2021-08-09 HISTORY — DX: Radiculopathy, lumbar region: M54.16

## 2021-08-13 DIAGNOSIS — I10 Essential (primary) hypertension: Secondary | ICD-10-CM | POA: Diagnosis not present

## 2021-08-13 DIAGNOSIS — T466X5A Adverse effect of antihyperlipidemic and antiarteriosclerotic drugs, initial encounter: Secondary | ICD-10-CM | POA: Diagnosis not present

## 2021-08-13 DIAGNOSIS — I679 Cerebrovascular disease, unspecified: Secondary | ICD-10-CM | POA: Diagnosis not present

## 2021-08-13 DIAGNOSIS — N390 Urinary tract infection, site not specified: Secondary | ICD-10-CM | POA: Diagnosis not present

## 2021-08-13 DIAGNOSIS — K529 Noninfective gastroenteritis and colitis, unspecified: Secondary | ICD-10-CM | POA: Diagnosis not present

## 2021-08-13 DIAGNOSIS — G720 Drug-induced myopathy: Secondary | ICD-10-CM | POA: Diagnosis not present

## 2021-08-13 DIAGNOSIS — J309 Allergic rhinitis, unspecified: Secondary | ICD-10-CM | POA: Diagnosis not present

## 2021-08-13 DIAGNOSIS — I498 Other specified cardiac arrhythmias: Secondary | ICD-10-CM | POA: Diagnosis not present

## 2021-08-13 DIAGNOSIS — E7800 Pure hypercholesterolemia, unspecified: Secondary | ICD-10-CM | POA: Diagnosis not present

## 2021-08-14 DIAGNOSIS — N390 Urinary tract infection, site not specified: Secondary | ICD-10-CM | POA: Diagnosis not present

## 2021-08-16 DIAGNOSIS — R1310 Dysphagia, unspecified: Secondary | ICD-10-CM | POA: Diagnosis not present

## 2021-08-16 DIAGNOSIS — R194 Change in bowel habit: Secondary | ICD-10-CM | POA: Diagnosis not present

## 2021-08-16 DIAGNOSIS — R5383 Other fatigue: Secondary | ICD-10-CM | POA: Diagnosis not present

## 2021-08-16 DIAGNOSIS — K921 Melena: Secondary | ICD-10-CM | POA: Diagnosis not present

## 2021-08-20 DIAGNOSIS — G720 Drug-induced myopathy: Secondary | ICD-10-CM | POA: Diagnosis not present

## 2021-08-20 DIAGNOSIS — D649 Anemia, unspecified: Secondary | ICD-10-CM | POA: Diagnosis not present

## 2021-08-20 DIAGNOSIS — K529 Noninfective gastroenteritis and colitis, unspecified: Secondary | ICD-10-CM | POA: Diagnosis not present

## 2021-08-20 DIAGNOSIS — E7800 Pure hypercholesterolemia, unspecified: Secondary | ICD-10-CM | POA: Diagnosis not present

## 2021-08-20 DIAGNOSIS — J309 Allergic rhinitis, unspecified: Secondary | ICD-10-CM | POA: Diagnosis not present

## 2021-08-20 DIAGNOSIS — T466X5A Adverse effect of antihyperlipidemic and antiarteriosclerotic drugs, initial encounter: Secondary | ICD-10-CM | POA: Diagnosis not present

## 2021-08-20 DIAGNOSIS — I679 Cerebrovascular disease, unspecified: Secondary | ICD-10-CM | POA: Diagnosis not present

## 2021-08-20 DIAGNOSIS — I498 Other specified cardiac arrhythmias: Secondary | ICD-10-CM | POA: Diagnosis not present

## 2021-08-20 DIAGNOSIS — I10 Essential (primary) hypertension: Secondary | ICD-10-CM | POA: Diagnosis not present

## 2021-08-29 DIAGNOSIS — I493 Ventricular premature depolarization: Secondary | ICD-10-CM | POA: Diagnosis not present

## 2021-08-29 DIAGNOSIS — K449 Diaphragmatic hernia without obstruction or gangrene: Secondary | ICD-10-CM | POA: Diagnosis not present

## 2021-08-29 DIAGNOSIS — R1310 Dysphagia, unspecified: Secondary | ICD-10-CM | POA: Diagnosis not present

## 2021-08-29 DIAGNOSIS — K635 Polyp of colon: Secondary | ICD-10-CM | POA: Diagnosis not present

## 2021-08-29 DIAGNOSIS — K5730 Diverticulosis of large intestine without perforation or abscess without bleeding: Secondary | ICD-10-CM | POA: Diagnosis not present

## 2021-08-29 DIAGNOSIS — R194 Change in bowel habit: Secondary | ICD-10-CM | POA: Diagnosis not present

## 2021-08-29 DIAGNOSIS — K644 Residual hemorrhoidal skin tags: Secondary | ICD-10-CM | POA: Diagnosis not present

## 2021-08-29 DIAGNOSIS — K222 Esophageal obstruction: Secondary | ICD-10-CM | POA: Diagnosis not present

## 2021-08-29 DIAGNOSIS — Z86010 Personal history of colonic polyps: Secondary | ICD-10-CM | POA: Diagnosis not present

## 2021-08-30 DIAGNOSIS — M5416 Radiculopathy, lumbar region: Secondary | ICD-10-CM | POA: Diagnosis not present

## 2021-09-02 DIAGNOSIS — H9201 Otalgia, right ear: Secondary | ICD-10-CM | POA: Diagnosis not present

## 2021-09-11 DIAGNOSIS — M545 Low back pain, unspecified: Secondary | ICD-10-CM | POA: Diagnosis not present

## 2021-09-11 DIAGNOSIS — M5416 Radiculopathy, lumbar region: Secondary | ICD-10-CM | POA: Diagnosis not present

## 2021-09-17 DIAGNOSIS — I10 Essential (primary) hypertension: Secondary | ICD-10-CM | POA: Diagnosis not present

## 2021-09-17 DIAGNOSIS — G8929 Other chronic pain: Secondary | ICD-10-CM | POA: Diagnosis not present

## 2021-09-17 DIAGNOSIS — I498 Other specified cardiac arrhythmias: Secondary | ICD-10-CM | POA: Diagnosis not present

## 2021-09-17 DIAGNOSIS — E785 Hyperlipidemia, unspecified: Secondary | ICD-10-CM | POA: Diagnosis not present

## 2021-09-17 DIAGNOSIS — I679 Cerebrovascular disease, unspecified: Secondary | ICD-10-CM | POA: Diagnosis not present

## 2021-09-17 DIAGNOSIS — J309 Allergic rhinitis, unspecified: Secondary | ICD-10-CM | POA: Diagnosis not present

## 2021-09-17 DIAGNOSIS — M545 Low back pain, unspecified: Secondary | ICD-10-CM | POA: Diagnosis not present

## 2021-09-17 DIAGNOSIS — I493 Ventricular premature depolarization: Secondary | ICD-10-CM | POA: Diagnosis not present

## 2021-09-17 DIAGNOSIS — R5382 Chronic fatigue, unspecified: Secondary | ICD-10-CM | POA: Diagnosis not present

## 2021-09-19 DIAGNOSIS — M545 Low back pain, unspecified: Secondary | ICD-10-CM | POA: Diagnosis not present

## 2021-09-25 ENCOUNTER — Other Ambulatory Visit: Payer: Self-pay | Admitting: Cardiology

## 2021-09-25 DIAGNOSIS — M48061 Spinal stenosis, lumbar region without neurogenic claudication: Secondary | ICD-10-CM | POA: Insufficient documentation

## 2021-09-25 HISTORY — DX: Spinal stenosis, lumbar region without neurogenic claudication: M48.061

## 2021-10-15 ENCOUNTER — Ambulatory Visit: Payer: Medicare PPO | Admitting: Cardiology

## 2021-10-22 ENCOUNTER — Ambulatory Visit: Payer: Medicare PPO | Attending: Cardiology | Admitting: Cardiology

## 2021-10-22 ENCOUNTER — Encounter: Payer: Self-pay | Admitting: Cardiology

## 2021-10-22 VITALS — BP 122/70 | HR 56 | Ht 65.0 in | Wt 138.8 lb

## 2021-10-22 DIAGNOSIS — I491 Atrial premature depolarization: Secondary | ICD-10-CM

## 2021-10-22 NOTE — Progress Notes (Signed)
Electrophysiology Office Note   Date:  10/22/2021   ID:  Olivia Blackburn, DOB 11-18-1945, MRN [id number]  PCP:  Cher Nakai, MD  Cardiologist:  Bettina Gavia Primary Electrophysiologist:  Kaiana Marion Meredith Leeds, MD    Chief Complaint: SVT   History of Present Illness: Olivia Blackburn is a 76 y.o. female who is being seen today for the evaluation of SVT at the request of Cher Nakai, MD. Presenting today for electrophysiology evaluation.  She has a history significant for frequent PACs, PVCs, hypertension, hyperlipidemia, prior stroke.  She was having episodes of marked weakness.  Heart rates were found to be in the 30s.  Cardiac monitor showed multiple PACs.  She was started on metoprolol and flecainide.  Today, denies symptoms of palpitations, chest pain, shortness of breath, orthopnea, PND, lower extremity edema, claudication, dizziness, presyncope, syncope, bleeding, or neurologic sequela. The patient is tolerating medications without difficulties.  Since being seen she has done well.  She has noted no further episodes of PACs or palpitations.  She is overall quite happy with how she has been feeling.    Past Medical History:  Diagnosis Date   Allergic rhinitis 07/04/2017   APC (atrial premature contractions) 07/07/2017   Cerebrovascular accident (CVA) ([place]) 07/04/2017   Closed fracture of base of fifth metatarsal bone 05/16/2017   Frequent PVCs 08/13/2017   GERD (gastroesophageal reflux disease) 07/04/2017   Hyperlipidemia 07/04/2017   Hypertension    Hyponatremia 07/04/2017   Migraine with prolonged aura, not intractable 07/04/2017   Mitral regurgitation 08/13/2017   Osteoporosis 07/04/2017   Palpitations 07/04/2017   Past Surgical History:  Procedure Laterality Date   ABDOMINAL HYSTERECTOMY     CYSTOCELE REPAIR     TONSILLECTOMY       Current Outpatient Medications  Medication Sig Dispense Refill   Calcium-Vitamin D-Vitamin K 500-500-40 MG-UNT-MCG CHEW Chew 1 tablet by mouth 2 (two)  times daily.      conjugated estrogens (PREMARIN) vaginal cream Place 1 Applicatorful vaginally 2 (two) times a week.      denosumab (PROLIA) 60 MG/ML SOLN injection Inject 60 mg into the skin every 6 (six) months. Administer in upper arm, thigh, or abdomen     ezetimibe (ZETIA) 10 MG tablet Take 10 mg by mouth daily.     flecainide (TAMBOCOR) 50 MG tablet Take 1 tablet (50 mg total) by mouth 2 (two) times daily. 60 tablet 3   lisinopril (PRINIVIL,ZESTRIL) 20 MG tablet Take 20 mg by mouth daily.     loratadine (CLARITIN) 10 MG tablet Take 1 tablet by mouth daily.     metoprolol succinate (TOPROL-XL) 50 MG 24 hr tablet TAKE 1 TABLET BY MOUTH DAILY WITH OR IMMEDIATELY FOLLOWING A MEAL. 90 tablet 2   nitroGLYCERIN (NITROSTAT) 0.4 MG SL tablet Place 1 tablet (0.4 mg total) under the tongue every 5 (five) minutes as needed for chest pain. If pain not relieved by 3rd dose, call 911. 30 tablet 0   omeprazole (PRILOSEC) 40 MG capsule Take 40 mg by mouth daily.     aspirin EC 81 MG tablet Take 81 mg by mouth daily.  (Patient not taking: Reported on 10/22/2021)     No current facility-administered medications for this visit.    Allergies:   Erythromycin, Ibuprofen, and Azithromycin   Social History:  The patient  reports that she has never smoked. She has never used smokeless tobacco. She reports that she does not currently use alcohol. She reports that she does not currently use  drugs.   Family History:  The patient's family history includes Bone cancer in her maternal grandfather; Cerebrovascular Disease in her father; Diabetes in her brother and sister; Heart attack in her brother; Heart disease in her brother and father; Hyperlipidemia in her father, paternal grandfather, and paternal grandmother; Hypertension in her father; Ovarian cancer in her mother; Pancreatic cancer in her sister; Stroke in her brother and father.   ROS:  Please see the history of present illness.   Otherwise, review of systems  is positive for none.   All other systems are reviewed and negative.   PHYSICAL EXAM: VS:  BP 122/70   Pulse (!) 56   Ht 5\' 5"  (1.651 m)   Wt 138 lb 12.8 oz (63 kg)   SpO2 99%   BMI 23.10 kg/m  , BMI Body mass index is 23.1 kg/m. GEN: Well nourished, well developed, in no acute distress  HEENT: normal  Neck: no JVD, carotid bruits, or masses Cardiac: RRR; no murmurs, rubs, or gallops,no edema  Respiratory:  clear to auscultation bilaterally, normal work of breathing GI: soft, nontender, nondistended, + BS MS: no deformity or atrophy  Skin: warm and dry Neuro:  Strength and sensation are intact Psych: euthymic mood, full affect  EKG:  EKG is ordered today. Personal review of the ekg ordered shows sinus rhythm, rate 56  Recent Labs: No results found for requested labs within last 365 days.    Lipid Panel  No results found for: "CHOL", "TRIG", "HDL", "CHOLHDL", "VLDL", "LDLCALC", "LDLDIRECT"   Wt Readings from Last 3 Encounters:  10/22/21 138 lb 12.8 oz (63 kg)  07/02/21 137 lb 12.8 oz (62.5 kg)  06/25/21 138 lb (62.6 kg)      Other studies Reviewed: Additional studies/ records that were reviewed today include: TTE 03/08/19  Review of the above records today demonstrates:   1. Left ventricular ejection fraction, by estimation, is 55%. The left  ventricle has low normal function. The left ventricle has no regional wall  motion abnormalities. Left ventricular diastolic parameters are consistent  with Grade II diastolic  dysfunction (pseudonormalization).   2. Right ventricular systolic function is normal. The right ventricular  size is normal. There is normal pulmonary artery systolic pressure. The  estimated right ventricular systolic pressure is 123456 mmHg.   3. Left atrial size was mild to moderately dilated.   4. The mitral valve is normal in structure and function. Moderate central  mitral valve regurgitation, appears functional. No evidence of mitral  stenosis.    5. The aortic valve is tricuspid. Aortic valve regurgitation is not  visualized. No aortic stenosis is present.   6. The inferior vena cava is normal in size with greater than 50%  respiratory variability, suggesting right atrial pressure of 3 mmHg.   Monitor 05/30/2020 personally reviewed frequent supraventricular ectopy with frequent brief episodes of SVT.   ASSESSMENT AND PLAN:  1.  PACs: Symptoms of fatigue and shortness of breath.  Burden at 30%.  Currently on flecainide 50 mg twice daily.  Her PAC burden is significantly reduced.  She is completely asymptomatic.  She does report some lower extremity swelling towards the end of the day but otherwise has no complaints.   Current medicines are reviewed at length with the patient today.   The patient does not have concerns regarding her medicines.  The following changes were made today: None  Labs/ tests ordered today include:  Orders Placed This Encounter  Procedures   EKG 12-Lead  Disposition:   FU with Veida Spira 6 months  Signed, Carmin Alvidrez Meredith Leeds, MD  10/22/2021 12:19 PM     [place] [place] [address] [id number] [phone] (office) [phone] (fax)

## 2021-10-22 NOTE — Patient Instructions (Signed)
Medication Instructions:  Your physician recommends that you continue on your current medications as directed. Please refer to the Current Medication list given to you today.  *If you need a refill on your cardiac medications before your next appointment, please call your pharmacy*   Lab Work: None ordered   Testing/Procedures: None ordered   Follow-Up: At [facility], you and your health needs are our priority.  As part of our continuing mission to provide you with exceptional heart care, we have created designated Provider Care Teams.  These Care Teams include your primary Cardiologist (physician) and Advanced Practice Providers (APPs -  Physician Assistants and Nurse Practitioners) who all work together to provide you with the care you need, when you need it.  Your next appointment:   6 month(s)  The format for your next appointment:   In Person  Provider:   Will Camnitz, MD    Thank you for choosing [facility]!!   Mettie Roylance, RN [phone]  Other Instructions   Important Information About Sugar           

## 2021-10-25 ENCOUNTER — Other Ambulatory Visit: Payer: Self-pay | Admitting: Cardiology

## 2022-02-19 ENCOUNTER — Encounter: Payer: Self-pay | Admitting: Gastroenterology

## 2022-06-12 ENCOUNTER — Other Ambulatory Visit: Payer: Self-pay

## 2022-06-19 NOTE — Progress Notes (Unsigned)
Cardiology Office Note:    Date:  06/20/2022   ID:  Olivia Blackburn, DOB 17-Oct-1945, MRN [id number]  PCP:  Simone Curia, MD  Cardiologist:  Norman Herrlich, MD    Referring MD: Simone Curia, MD    ASSESSMENT:    1. APC (atrial premature contractions)   2. Premature ventricular contractions (PVCs) (VPCs)   3. Non-rheumatic mitral regurgitation   4. Primary hypertension   5. Pure hypercholesterolemia    PLAN:    In order of problems listed above:  She continues to do well with suppression of her atrial and ventricular arrhythmia on flecainide continue the same nothing she requires cardiac monitors at this time. Stable mitral regurgitation last echocardiogram was mild asymptomatic anemia.  EKG repeated this year Potentially well-controlled she will continue treatment with therapy beta-blocker ACE inhibitor and has an arrangement for follow-up labs in few weeks including a lipid profile and she takes Zetia and statin therapy   Next appointment: 1 year   Medication Adjustments/Labs and Tests Ordered: Current medicines are reviewed at length with the patient today.  Concerns regarding medicines are outlined above.  No orders of the defined types were placed in this encounter.  No orders of the defined types were placed in this encounter.   Chief complaint follow-up on flecainide   History of Present Illness:    Olivia Blackburn is a 77 y.o. female with a hx of frequent atrial arrhythmia symptomatic PVCs hypertension hyperlipidemia previous stroke mild mitral regurgitation and mild enlargement ascending aorta 37 mm.  Last seen 07/02/2021.  She was on flecainide for arrhythmia suppression.  She was seen by EP Dr. Elberta Fortis with a very high frequency of APCs burden of 30% asymptomatic was felt that a good response to antiarrhythmic therapy and continued on flecainide.  Compliance with diet, lifestyle and medications: Yes  She is doing very well she is reduce stress in her life she has an  apple smart watch and has had no alerts for high or low rate and no symptomatic events or atrial fibrillation Home blood pressure runs 110s to 120 systolic she avoids over-the-counter proarrhythmic drugs and she has had no side effects from flecainide. No cardiovascular complaints of edema shortness of breath chest pain palpitation or syncope She has an upcoming office visit in 2 weeks with labs in her PCP office Past Medical History:  Diagnosis Date   Allergic rhinitis 07/04/2017   APC (atrial premature contractions) 07/07/2017   Bradycardia with 31-40 beats per minute 05/05/2020   Cerebrovascular accident (CVA) (HCC) 07/04/2017   Closed fracture of base of fifth metatarsal bone 05/16/2017   Fracture of inferior pubic ramus (HCC) 12/27/2020   Frequent PVCs 08/13/2017   GERD (gastroesophageal reflux disease) 07/04/2017   Hyperlipidemia 07/04/2017   Hypertension    Hyponatremia 07/04/2017   Intrinsic sphincter deficiency (ISD) 10/22/2017   Formatting of this note might be different from the original.  Added automatically from request for surgery [id number]   Low back pain 07/04/2021   Lumbar radiculopathy 08/09/2021   Migraine with prolonged aura, not intractable 07/04/2017   Mitral regurgitation 08/13/2017   Osteoporosis 07/04/2017   Pain of left hip joint 12/27/2020   Palpitations 07/04/2017   Premature ventricular contractions (PVCs) (VPCs) 08/13/2017   Spinal stenosis of lumbar region 09/25/2021   Sprain of ankle 05/16/2017    Past Surgical History:  Procedure Laterality Date   ABDOMINAL HYSTERECTOMY     CYSTOCELE REPAIR     TONSILLECTOMY  Current Medications: No outpatient medications have been marked as taking for the 06/20/22 encounter (Appointment) with Baldo Daub, MD.     Allergies:   Erythromycin, Ibuprofen, and Azithromycin   Social History   Socioeconomic History   Marital status: Widowed    Spouse name: Not on file   Number of children: Not on file    Years of education: Not on file   Highest education level: Not on file  Occupational History   Not on file  Tobacco Use   Smoking status: Never   Smokeless tobacco: Never  Vaping Use   Vaping Use: Never used  Substance and Sexual Activity   Alcohol use: Not Currently   Drug use: Not Currently   Sexual activity: Not on file  Other Topics Concern   Not on file  Social History Narrative   Not on file   Social Determinants of Health   Financial Resource Strain: Not on file  Food Insecurity: Not on file  Transportation Needs: Not on file  Physical Activity: Not on file  Stress: Not on file  Social Connections: Not on file     Family History: The patient's family history includes Bone cancer in her maternal grandfather; Cerebrovascular Disease in her father; Diabetes in her brother and sister; Heart attack in her brother; Heart disease in her brother and father; Hyperlipidemia in her father, paternal grandfather, and paternal grandmother; Hypertension in her father; Ovarian cancer in her mother; Pancreatic cancer in her sister; Stroke in her brother and father. ROS:   Please see the history of present illness.    All other systems reviewed and are negative.  EKGs/Labs/Other Studies Reviewed:    The following studies were reviewed today:  Cardiac Studies & Procedures     STRESS TESTS  MYOCARDIAL PERFUSION IMAGING 03/08/2019  Narrative  The left ventricular ejection fraction is normal (55-65%).  Nuclear stress EF: 62%.  There was no ST segment deviation noted during stress.  The study is normal.  This is a low risk study.  Normal resting and stress perfusion. No ischemia or infarction EF 62%   ECHOCARDIOGRAM  ECHOCARDIOGRAM COMPLETE 06/13/2020  Narrative ECHOCARDIOGRAM REPORT    Patient Name:   Olivia Blackburn Date of Exam: 06/07/2020 Medical Rec #:  [id number]      Height:       66.0 in Accession #:    [id number]     Weight:       130.0 lb Date of Birth:   10-25-1945      BSA:          1.665 m Patient Age:    74 years       BP:           130/68 mmHg Patient Gender: F              HR:           65 bpm. Exam Location:  [place]  Procedure: 2D Echo  Indications:    Primary hypertension [I10 (ICD-10-CM)]; Bradycardia with 31-40 beats per minute [R00.1 (ICD-10-CM)]; Premature ventricular contractions (PVCs) (VPCs) [I49.3 (ICD-10-CM)]  History:        Patient has prior history of Echocardiogram examinations, most recent 03/08/2019. Non-rheumatic mitral regurgitation; Risk Factors:Dyslipidemia.  Sonographer:    Louie Boston Referring Phys: [id number] Hisham Provence J Ruthetta Koopmann  IMPRESSIONS   1. Left ventricular ejection fraction, by estimation, is 55 to 60%. The left ventricle has normal function. The left ventricle has no regional wall motion abnormalities.  There is moderate concentric left ventricular hypertrophy. Left ventricular diastolic parameters are consistent with Grade II diastolic dysfunction (pseudonormalization). 2. Right ventricular systolic function is normal. The right ventricular size is normal. There is normal pulmonary artery systolic pressure. 3. Left atrial size was mildly dilated. 4. The mitral valve is normal in structure. Mild mitral valve regurgitation. No evidence of mitral stenosis. 5. The aortic valve is tricuspid. Aortic valve regurgitation is not visualized. No aortic stenosis is present. 6. There is mild dilatation of the ascending aorta, measuring 37 mm. 7. The inferior vena cava is normal in size with greater than 50% respiratory variability, suggesting right atrial pressure of 3 mmHg.  FINDINGS Left Ventricle: Left ventricular ejection fraction, by estimation, is 55 to 60%. The left ventricle has normal function. The left ventricle has no regional wall motion abnormalities. The left ventricular internal cavity size was normal in size. There is moderate concentric left ventricular hypertrophy. Left ventricular diastolic  parameters are consistent with Grade II diastolic dysfunction (pseudonormalization).  Right Ventricle: The right ventricular size is normal. No increase in right ventricular wall thickness. Right ventricular systolic function is normal. There is normal pulmonary artery systolic pressure. The tricuspid regurgitant velocity is 1.97 m/s, and with an assumed right atrial pressure of 3 mmHg, the estimated right ventricular systolic pressure is 18.5 mmHg.  Left Atrium: Left atrial size was mildly dilated.  Right Atrium: Right atrial size was normal in size.  Pericardium: There is no evidence of pericardial effusion.  Mitral Valve: The mitral valve is normal in structure. Mild mitral valve regurgitation. No evidence of mitral valve stenosis.  Tricuspid Valve: The tricuspid valve is normal in structure. Tricuspid valve regurgitation is trivial. No evidence of tricuspid stenosis.  Aortic Valve: The aortic valve is tricuspid. Aortic valve regurgitation is not visualized. No aortic stenosis is present.  Pulmonic Valve: The pulmonic valve was normal in structure. Pulmonic valve regurgitation is not visualized. No evidence of pulmonic stenosis.  Aorta: The aortic root is normal in size and structure and the aortic arch was not well visualized. There is mild dilatation of the ascending aorta, measuring 37 mm.  Venous: The pulmonary veins were not well visualized. The inferior vena cava is normal in size with greater than 50% respiratory variability, suggesting right atrial pressure of 3 mmHg.  IAS/Shunts: No atrial level shunt detected by color flow Doppler.   LEFT VENTRICLE PLAX 2D LVIDd:         5.30 cm  Diastology LVIDs:         2.80 cm  LV e' medial:    6.60 cm/s LV PW:         1.20 cm  LV E/e' medial:  14.3 LV IVS:        1.20 cm  LV e' lateral:   9.48 cm/s LVOT diam:     1.90 cm  LV E/e' lateral: 9.9 LV SV:         63 LV SV Index:   38 LVOT Area:     2.84 cm   RIGHT VENTRICLE             IVC RV S prime:     8.92 cm/s  IVC diam: 1.70 cm TAPSE (M-mode): 2.0 cm  LEFT ATRIUM             Index       RIGHT ATRIUM           Index LA diam:  3.90 cm 2.34 cm/m  RA Area:     11.00 cm LA Vol (A2C):   72.8 ml 43.72 ml/m RA Volume:   21.90 ml  13.15 ml/m LA Vol (A4C):   52.4 ml 31.47 ml/m LA Biplane Vol: 62.5 ml 37.53 ml/m AORTIC VALVE LVOT Vmax:   84.20 cm/s LVOT Vmean:  57.000 cm/s LVOT VTI:    0.221 m  AORTA Ao Root diam: 3.10 cm Ao Asc diam:  3.70 cm Ao Desc diam: 2.00 cm  MITRAL VALVE               TRICUSPID VALVE MV Area (PHT): 2.74 cm    TR Peak grad:   15.5 mmHg MV Decel Time: 277 msec    TR Vmax:        197.00 cm/s MV E velocity: 94.30 cm/s MV A velocity: 81.00 cm/s  SHUNTS MV E/A ratio:  1.16        Systemic VTI:  0.22 m Systemic Diam: 1.90 cm  Norman Herrlich MD Electronically signed by Norman Herrlich MD Signature Date/Time: 06/13/2020/5:38:04 PM    Final    MONITORS  LONG TERM MONITOR (3-14 DAYS) 07/07/2020  Narrative Patch Wear Time:  3 days and 0 hours (2022-06-08T13:44:03-398 to 2022-06-11T14:08:15-0400)  Patient had a min HR of 48 bpm, max HR of 160 bpm, and avg HR of 69 bpm. Predominant underlying rhythm was Sinus Rhythm. 10 Supraventricular Tachycardia runs occurred, the run with the fastest interval lasting 4 beats with a max rate of 160 bpm, the longest lasting 6 beats with an avg rate of 105 bpm. Isolated SVEs were frequent (14.2%, 69629), SVE Couplets were rare (<1.0%, 700), and SVE Triplets were rare (<1.0%, 181). Isolated VEs were rare (<1.0%), and no VE Couplets or VE Triplets were present.  There were 1 triggered and 1 diary event both associated with frequent APCs Ventricular ectopy was rare with no couplets or triplets or episodes of ventricular tachycardia. Supraventricular ectopy was frequent burden 14% and no episodes of atrial fibrillation or flutter. There were 10 short episodes of runs of APCs the longest 6 complexes at a  rate of 105 bpm.   Conclusion although supraventricular ectopy remains frequent there has been a marked decrease in the absolute frequency and number of episodes of atrial tachycardia with antiarrhythmic drug therapy.           EKG:  EKG ordered today and personally reviewed.  The ekg ordered today demonstrates sinus bradycardia 53 bpm first-degree AV block no signs of group 1C antiarrhythmic drug cardiotoxicity  Recent Labs: No results found for requested labs within last 365 days.  Recent Lipid Panel No results found for: "CHOL", "TRIG", "HDL", "CHOLHDL", "VLDL", "LDLCALC", "LDLDIRECT"  Physical Exam:    VS:  There were no vitals taken for this visit.    Wt Readings from Last 3 Encounters:  10/22/21 138 lb 12.8 oz (63 kg)  07/02/21 137 lb 12.8 oz (62.5 kg)  06/25/21 138 lb (62.6 kg)     GEN:  Well nourished, well developed in no acute distress HEENT: Normal NECK: No JVD; No carotid bruits LYMPHATICS: No lymphadenopathy CARDIAC: RRR, no murmurs, rubs, gallops RESPIRATORY:  Clear to auscultation without rales, wheezing or rhonchi  ABDOMEN: Soft, non-tender, non-distended MUSCULOSKELETAL:  No edema; No deformity  SKIN: Warm and dry NEUROLOGIC:  Alert and oriented x 3 PSYCHIATRIC:  Normal affect    Signed, Norman Herrlich, MD  06/20/2022 9:10 AM    [facility]

## 2022-06-20 ENCOUNTER — Encounter: Payer: Self-pay | Admitting: Cardiology

## 2022-06-20 ENCOUNTER — Ambulatory Visit: Payer: Medicare PPO | Attending: Cardiology | Admitting: Cardiology

## 2022-06-20 VITALS — BP 120/56 | HR 53 | Ht 63.0 in | Wt 136.4 lb

## 2022-06-20 DIAGNOSIS — I10 Essential (primary) hypertension: Secondary | ICD-10-CM

## 2022-06-20 DIAGNOSIS — I493 Ventricular premature depolarization: Secondary | ICD-10-CM

## 2022-06-20 DIAGNOSIS — E7800 Pure hypercholesterolemia, unspecified: Secondary | ICD-10-CM

## 2022-06-20 DIAGNOSIS — I491 Atrial premature depolarization: Secondary | ICD-10-CM

## 2022-06-20 DIAGNOSIS — I340 Nonrheumatic mitral (valve) insufficiency: Secondary | ICD-10-CM | POA: Diagnosis not present

## 2022-06-20 MED ORDER — METOPROLOL SUCCINATE ER 50 MG PO TB24: ORAL_TABLET | ORAL | 3 refills | Status: DC

## 2022-06-20 MED ORDER — FLECAINIDE ACETATE 50 MG PO TABS: 50.0000 mg | ORAL_TABLET | Freq: Two times a day (BID) | ORAL | 3 refills | Status: DC

## 2022-06-20 NOTE — Patient Instructions (Addendum)
Medication Instructions:  Your physician recommends that you continue on your current medications as directed. Please refer to the Current Medication list given to you today.  *If you need a refill on your cardiac medications before your next appointment, please call your pharmacy*   Lab Work: None If you have labs (blood work) drawn today and your tests are completely normal, you will receive your results only by: MyChart Message (if you have MyChart) OR A paper copy in the mail If you have any lab test that is abnormal or we need to change your treatment, we will call you to review the results.   Testing/Procedures: None   Follow-Up: At [facility], you and your health needs are our priority.  As part of our continuing mission to provide you with exceptional heart care, we have created designated Provider Care Teams.  These Care Teams include your primary Cardiologist (physician) and Advanced Practice Providers (APPs -  Physician Assistants and Nurse Practitioners) who all work together to provide you with the care you need, when you need it.  We recommend signing up for the patient portal called "MyChart".  Sign up information is provided on this After Visit Summary.  MyChart is used to connect with patients for Virtual Visits (Telemedicine).  Patients are able to view lab/test results, encounter notes, upcoming appointments, etc.  Non-urgent messages can be sent to your provider as well.   To learn more about what you can do with MyChart, go to [email or web address].com.    Your next appointment:   1 year(s)  Provider:   Brian Munley, MD    Other Instructions None  1. Avoid all over-the-counter antihistamines except Claritin/Loratadine and Zyrtec/Cetrizine. 2. Avoid all combination including cold sinus allergies flu decongestant and sleep medications 3. You can use Robitussin DM Mucinex and Mucinex DM for cough. 4. can use Tylenol aspirin ibuprofen and naproxen but no  combinations such as sleep or sinus.  

## 2022-08-05 ENCOUNTER — Ambulatory Visit: Payer: Medicare PPO | Attending: Cardiology | Admitting: Cardiology

## 2022-08-05 ENCOUNTER — Encounter: Payer: Self-pay | Admitting: Cardiology

## 2022-08-05 VITALS — BP 122/64 | HR 60 | Ht 66.0 in | Wt 135.0 lb

## 2022-08-05 DIAGNOSIS — I491 Atrial premature depolarization: Secondary | ICD-10-CM | POA: Diagnosis not present

## 2022-08-05 DIAGNOSIS — I10 Essential (primary) hypertension: Secondary | ICD-10-CM

## 2022-08-05 NOTE — Progress Notes (Signed)
  Electrophysiology Office Note:   Date:  08/05/2022  ID:  Olivia Blackburn, DOB 1945/10/29, MRN [id number]  Primary Cardiologist: Norman Herrlich, MD Electrophysiologist: Regan Lemming, MD      History of Present Illness:   Olivia Blackburn is a 77 y.o. female with h/o PACs seen today for routine electrophysiology followup.  Since last being seen in our clinic the patient reports doing well.  She has noted no further PACs.  She has been quite active, able to do all of her daily activities without any restriction.  she denies chest pain, palpitations, dyspnea, PND, orthopnea, nausea, vomiting, dizziness, syncope, edema, weight gain, or early satiety.   Review of systems complete and found to be negative unless listed in HPI.   EP Information / Studies Reviewed:    EKG is not ordered today. EKG from 06/21/22 reviewed which showed sinus rhythm, 1dAVB        Risk Assessment/Calculations:              Physical Exam:   VS:  BP 122/64   Pulse 60   Ht 5\' 6"  (1.676 m)   Wt 135 lb (61.2 kg)   SpO2 97%   BMI 21.79 kg/m    Wt Readings from Last 3 Encounters:  08/05/22 135 lb (61.2 kg)  06/20/22 136 lb 6.4 oz (61.9 kg)  10/22/21 138 lb 12.8 oz (63 kg)     GEN: Well nourished, well developed in no acute distress NECK: No JVD; No carotid bruits CARDIAC: Regular rate and rhythm, no murmurs, rubs, gallops RESPIRATORY:  Clear to auscultation without rales, wheezing or rhonchi  ABDOMEN: Soft, non-tender, non-distended EXTREMITIES:  No edema; No deformity   ASSESSMENT AND PLAN:    1.  PACs: Had symptoms of fatigue and shortness of breath with a 30% burden.  Has been started on flecainide with a significant improvement in her PAC burden.  She has no further shortness of breath and is able to do her daily activities.  No changes.  2.  Hypertension: Currently well-controlled  Follow up with Dr. Elberta Fortis in 12 months  Signed, Attallah Ontko Jorja Loa, MD

## 2022-08-05 NOTE — Patient Instructions (Signed)
Medication Instructions:  Your physician recommends that you continue on your current medications as directed. Please refer to the Current Medication list given to you today.  *If you need a refill on your cardiac medications before your next appointment, please call your pharmacy*   Lab Work: None ordered   Testing/Procedures: None ordered   Follow-Up: At [facility], you and your health needs are our priority.  As part of our continuing mission to provide you with exceptional heart care, we have created designated Provider Care Teams.  These Care Teams include your primary Cardiologist (physician) and Advanced Practice Providers (APPs -  Physician Assistants and Nurse Practitioners) who all work together to provide you with the care you need, when you need it.  Your next appointment:   1 year(s)  The format for your next appointment:   In Person  Provider:   Loman Brooklyn, MD    Thank you for choosing [facility]!!   [phone]

## 2022-09-08 DIAGNOSIS — B370 Candidal stomatitis: Secondary | ICD-10-CM | POA: Insufficient documentation

## 2023-04-04 ENCOUNTER — Telehealth: Payer: Self-pay

## 2023-04-04 DIAGNOSIS — M722 Plantar fascial fibromatosis: Secondary | ICD-10-CM | POA: Insufficient documentation

## 2023-04-04 DIAGNOSIS — M7741 Metatarsalgia, right foot: Secondary | ICD-10-CM | POA: Insufficient documentation

## 2023-04-04 NOTE — Telephone Encounter (Signed)
   Pre-operative Risk Assessment    Patient Name: Olivia Blackburn  DOB: 1945/11/16 MRN: [id number]   Date of last office visit: 07/26/22 DR WILL Daphine Deutscher CAMNITZ Date of next office visit: 06/17/23 DR BRAIN [facility]   Request for Surgical Clearance    Procedure:   RIGHT FOOT 2ND, 3RD AND 4TH MT WELL OSTEOTOMEIS (MIS); POSSIBLE FLEXOR TENOTOMIES; POSSIBLE DORSAL CAPSULOTOMIES; 2ND, 3RD AND 4TH HAMMERTOE CORRECTIONS  Date of Surgery:  Clearance TBD                                Surgeon:  DR Jonny Ruiz HEWITT Surgeon's Group or Practice Name:  [facility] Phone number:  [phone] Fax number:  [phone] ATTN: MEGAN DAVIS   Type of Clearance Requested:   - Medical    Type of Anesthesia:  General    Additional requests/questions:    SignedMarlow Baars   04/04/2023, 4:59 PM

## 2023-04-07 NOTE — Telephone Encounter (Signed)
 No need to schedule tele preop appt as the pt states her surgery will not be until August sometime and she is seeing Dr. Dulce Sellar 06/17/23. Pt agrees she does not need a tele appt as she will see Dr.Munley for the -preop clearance.  I will update all parties involved.

## 2023-04-07 NOTE — Telephone Encounter (Signed)
   Name: Olivia Blackburn  DOB: 16-May-1945  MRN: [id number]  Primary Cardiologist: Norman Herrlich, MD   Preoperative team, please contact this patient and set up a phone call appointment for further preoperative risk assessment. Please obtain consent and complete medication review. Thank you for your help.  I confirm that guidance regarding antiplatelet and oral anticoagulation therapy has been completed and, if necessary, noted below.  Patient is not on antiplatelet or anticoagulation therapy.  I also confirmed the patient resides in the state of [place]. As per [facility] Medical Board telemedicine laws, the patient must reside in the state in which the provider is licensed.   Denyce Robert, NP 04/07/2023, 8:17 AM [facility]

## 2023-04-07 NOTE — Telephone Encounter (Signed)
 Left message to call back to schedule tele pre op appt.

## 2023-04-07 NOTE — Telephone Encounter (Signed)
 Patient is returning call.

## 2023-05-27 DIAGNOSIS — M533 Sacrococcygeal disorders, not elsewhere classified: Secondary | ICD-10-CM | POA: Insufficient documentation

## 2023-06-17 ENCOUNTER — Encounter: Payer: Self-pay | Admitting: Cardiology

## 2023-06-17 ENCOUNTER — Ambulatory Visit: Attending: Cardiology | Admitting: Cardiology

## 2023-06-17 ENCOUNTER — Ambulatory Visit: Admitting: Cardiology

## 2023-06-17 VITALS — BP 122/74 | HR 58 | Ht 65.0 in | Wt 141.4 lb

## 2023-06-17 DIAGNOSIS — I340 Nonrheumatic mitral (valve) insufficiency: Secondary | ICD-10-CM | POA: Diagnosis not present

## 2023-06-17 DIAGNOSIS — I493 Ventricular premature depolarization: Secondary | ICD-10-CM | POA: Diagnosis not present

## 2023-06-17 DIAGNOSIS — I491 Atrial premature depolarization: Secondary | ICD-10-CM | POA: Diagnosis not present

## 2023-06-17 DIAGNOSIS — Z01810 Encounter for preprocedural cardiovascular examination: Secondary | ICD-10-CM

## 2023-06-17 DIAGNOSIS — Z01818 Encounter for other preprocedural examination: Secondary | ICD-10-CM

## 2023-06-17 DIAGNOSIS — I10 Essential (primary) hypertension: Secondary | ICD-10-CM

## 2023-06-17 NOTE — Patient Instructions (Addendum)
 Medication Instructions:  Your physician recommends that you continue on your current medications as directed. Please refer to the Current Medication list given to you today.  *If you need a refill on your cardiac medications before your next appointment, please call your pharmacy*   Lab Work: None Ordered If you have labs (blood work) drawn today and your tests are completely normal, you will receive your results only by: MyChart Message (if you have MyChart) OR A paper copy in the mail If you have any lab test that is abnormal or we need to change your treatment, we will call you to review the results.   Testing/Procedures: Your physician has requested that you have an echocardiogram. Echocardiography is a painless test that uses sound waves to create images of your heart. It provides your doctor with information about the size and shape of your heart and how well your heart's chambers and valves are working. This procedure takes approximately one hour. There are no restrictions for this procedure. Please do NOT wear cologne, perfume, aftershave, or lotions (deodorant is allowed). Please arrive 15 minutes prior to your appointment time.  Please note: We ask at that you not bring children with you during ultrasound (echo/ vascular) testing. Due to room size and safety concerns, children are not allowed in the ultrasound rooms during exams. Our front office staff cannot provide observation of children in our lobby area while testing is being conducted. An adult accompanying a patient to their appointment will only be allowed in the ultrasound room at the discretion of the ultrasound technician under special circumstances. We apologize for any inconvenience.   We will order CT coronary calcium score. It will cost $99.00 and is not covered by insurance.  Please call to schedule.    [facility] [address] [place], [place] [address] [phone]   Follow-Up: At [facility], you  and your health needs are our priority.  As part of our continuing mission to provide you with exceptional heart care, we have created designated Provider Care Teams.  These Care Teams include your primary Cardiologist (physician) and Advanced Practice Providers (APPs -  Physician Assistants and Nurse Practitioners) who all work together to provide you with the care you need, when you need it.  We recommend signing up for the patient portal called "MyChart".  Sign up information is provided on this After Visit Summary.  MyChart is used to connect with patients for Virtual Visits (Telemedicine).  Patients are able to view lab/test results, encounter notes, upcoming appointments, etc.  Non-urgent messages can be sent to your provider as well.   To learn more about what you can do with MyChart, go to [email or web address].    Your next appointment:   12 month(s)  The format for your next appointment:   In Person  Provider:   Ralene Burger, MD    Other Instructions NA

## 2023-06-17 NOTE — Progress Notes (Signed)
 Cardiology Office Note:    Date:  06/17/2023   ID:  Olivia Blackburn, DOB February 27, 1945, MRN [id number]  PCP:  Angelique Barer, MD  Cardiologist:  Ralene Burger, MD    Referring MD: Angelique Barer, MD   Chief Complaint  Patient presents with   Clearance TBD    History of Present Illness:    Olivia Blackburn is a 78 y.o. female past medical history significant for symptomatic PVCs, essential hypertension, hyperlipidemia intolerant to statin, mild mitral valve regurgitation, mild enlargement of the aorta but only 37 mm which is considered upper limits of normal, she comes today to my office for follow-up as well as for evaluation before elective right foot surgery.  Really not sure exactly how surgery will be done meaning is a general spinal local anesthesia but she tells me that in spite of prone with her foot she is able to go to gym 3-4 times a week she stayed at the gym for 1 hour she goes on elliptical she can paddle and walk on the elliptical for about 30 minutes with no difficulties she did not notice any decrease in ability to exercise within last few months.  There is no chest pain tightness squeezing pressure burning chest no palpitations no dizziness no swelling of lower extremities  Past Medical History:  Diagnosis Date   Allergic rhinitis 07/04/2017   APC (atrial premature contractions) 07/07/2017   Bradycardia with 31-40 beats per minute 05/05/2020   Cerebrovascular accident (CVA) (HCC) 07/04/2017   Closed fracture of base of fifth metatarsal bone 05/16/2017   Fracture of inferior pubic ramus (HCC) 12/27/2020   Frequent PVCs 08/13/2017   GERD (gastroesophageal reflux disease) 07/04/2017   Hyperlipidemia 07/04/2017   Hypertension    Hyponatremia 07/04/2017   Intrinsic sphincter deficiency (ISD) 10/22/2017   Formatting of this note might be different from the original.  Added automatically from request for surgery [id number]   Low back pain 07/04/2021   Lumbar radiculopathy 08/09/2021    Migraine with prolonged aura, not intractable 07/04/2017   Mitral regurgitation 08/13/2017   Osteoporosis 07/04/2017   Pain of left hip joint 12/27/2020   Palpitations 07/04/2017   Premature ventricular contractions (PVCs) (VPCs) 08/13/2017   Spinal stenosis of lumbar region 09/25/2021   Sprain of ankle 05/16/2017    Past Surgical History:  Procedure Laterality Date   ABDOMINAL HYSTERECTOMY     CYSTOCELE REPAIR     TONSILLECTOMY      Current Medications: Current Meds  Medication Sig   Calcium-Vitamin D-Vitamin K 500-500-40 MG-UNT-MCG CHEW Chew 1 tablet by mouth 2 (two) times daily.    conjugated estrogens (PREMARIN) vaginal cream Place 1 Applicatorful vaginally 2 (two) times a week.    denosumab  (PROLIA ) 60 MG/ML SOLN injection Inject 60 mg into the skin every 6 (six) months. Administer in upper arm, thigh, or abdomen   ezetimibe (ZETIA) 10 MG tablet Take 10 mg by mouth daily.   flecainide  (TAMBOCOR ) 50 MG tablet Take 1 tablet (50 mg total) by mouth 2 (two) times daily.   lisinopril (ZESTRIL) 10 MG tablet Take 10 mg by mouth 2 (two) times daily.   loratadine (CLARITIN) 10 MG tablet Take 1 tablet by mouth daily.   metoprolol  succinate (TOPROL -XL) 50 MG 24 hr tablet TAKE 1 TABLET BY MOUTH DAILY WITH OR IMMEDIATELY FOLLOWING A MEAL. (Patient taking differently: Take 50 mg by mouth daily. TAKE 1 TABLET BY MOUTH DAILY WITH OR IMMEDIATELY FOLLOWING A MEAL.)   nitrofurantoin, macrocrystal-monohydrate, (MACROBID) 100 MG  capsule Take 100 mg by mouth 2 (two) times daily.   nitroGLYCERIN  (NITROSTAT ) 0.4 MG SL tablet Place 1 tablet (0.4 mg total) under the tongue every 5 (five) minutes as needed for chest pain. If pain not relieved by 3rd dose, call 911.   omeprazole (PRILOSEC) 40 MG capsule Take 40 mg by mouth daily.     Allergies:   Erythromycin, Ibuprofen, and Azithromycin   Social History   Socioeconomic History   Marital status: Widowed    Spouse name: Not on file   Number of  children: Not on file   Years of education: Not on file   Highest education level: Not on file  Occupational History   Not on file  Tobacco Use   Smoking status: Never   Smokeless tobacco: Never  Vaping Use   Vaping status: Never Used  Substance and Sexual Activity   Alcohol use: Not Currently   Drug use: Not Currently   Sexual activity: Not on file  Other Topics Concern   Not on file  Social History Narrative   Not on file   Social Drivers of Health   Financial Resource Strain: Not on file  Food Insecurity: Not on file  Transportation Needs: Not on file  Physical Activity: Not on file  Stress: Not on file  Social Connections: Not on file     Family History: The patient's family history includes Bone cancer in her maternal grandfather; Cerebrovascular Disease in her father; Diabetes in her brother and sister; Heart attack in her brother; Heart disease in her brother and father; Hyperlipidemia in her father, paternal grandfather, and paternal grandmother; Hypertension in her father; Ovarian cancer in her mother; Pancreatic cancer in her sister; Stroke in her brother and father. ROS:   Please see the history of present illness.    All 14 point review of systems negative except as described per history of present illness  EKGs/Labs/Other Studies Reviewed:         Recent Labs: No results found for requested labs within last 365 days.  Recent Lipid Panel No results found for: "CHOL", "TRIG", "HDL", "CHOLHDL", "VLDL", "LDLCALC", "LDLDIRECT"  Physical Exam:    VS:  BP 122/74 (BP Location: Right Arm, Patient Position: Sitting)   Pulse (!) 58   Ht 5\' 5"  (1.651 m)   Wt 141 lb 6.4 oz (64.1 kg)   SpO2 93%   BMI 23.53 kg/m     Wt Readings from Last 3 Encounters:  06/17/23 141 lb 6.4 oz (64.1 kg)  08/05/22 135 lb (61.2 kg)  06/20/22 136 lb 6.4 oz (61.9 kg)     GEN:  Well nourished, well developed in no acute distress HEENT: Normal NECK: No JVD; No carotid  bruits LYMPHATICS: No lymphadenopathy CARDIAC: RRR, no murmurs, no rubs, no gallops RESPIRATORY:  Clear to auscultation without rales, wheezing or rhonchi  ABDOMEN: Soft, non-tender, non-distended MUSCULOSKELETAL:  No edema; No deformity  SKIN: Warm and dry LOWER EXTREMITIES: no swelling NEUROLOGIC:  Alert and oriented x 3 PSYCHIATRIC:  Normal affect   ASSESSMENT:    1. Pre-op evaluation   2. Frequent PVCs   3. APC (atrial premature contractions)   4. Nonrheumatic mitral valve regurgitation   5. Primary hypertension   6. Preop cardiovascular exam    PLAN:    In order of problems listed above:  Cardiovascular preop evaluation.  Surgery overall is low risk, not sure exactly what anesthesia type that she will have however, overall from cardiac standpoint review she is  doing very well, she easily can do 4 METS, therefore, should be no problem proceeding with surgery with no reservations. Flecainide  use with PVCs and APCs.  There is a high risk scenario.  Will schedule to have echocardiogram to assess left ventricular ejection fraction, will also do calcium score to try to stratify her for atherosclerosis and based on that hopefully will be able to decide about how aggressive we need to be with cholesterol medications. Dyslipidemia I did review K PN which show me LDL 126 HDL 43 again calcium score will be done based on that we decide how aggressive we want to be.  If her calcium score will be high then we will consider PCSK9 agent   Medication Adjustments/Labs and Tests Ordered: Current medicines are reviewed at length with the patient today.  Concerns regarding medicines are outlined above.  Orders Placed This Encounter  Procedures   EKG 12-Lead   Medication changes: No orders of the defined types were placed in this encounter.   Signed, Manfred Seed, MD, [facility] 06/17/2023 1:25 PM    [facility]

## 2023-06-17 NOTE — Addendum Note (Signed)
 Addended by: Shawnee Dellen D on: 06/17/2023 01:39 PM   Modules accepted: Orders

## 2023-06-20 ENCOUNTER — Ambulatory Visit: Payer: Self-pay | Admitting: Cardiology

## 2023-06-20 ENCOUNTER — Ambulatory Visit (HOSPITAL_BASED_OUTPATIENT_CLINIC_OR_DEPARTMENT_OTHER)
Admission: RE | Admit: 2023-06-20 | Discharge: 2023-06-20 | Disposition: A | Discharge status: Home / Self Care | Payer: Self-pay | Source: Ambulatory Visit | Attending: Cardiology | Admitting: Cardiology

## 2023-06-20 DIAGNOSIS — Z01818 Encounter for other preprocedural examination: Secondary | ICD-10-CM

## 2023-06-25 ENCOUNTER — Other Ambulatory Visit: Payer: Self-pay | Admitting: Cardiology

## 2023-06-30 ENCOUNTER — Telehealth: Payer: Self-pay | Admitting: *Deleted

## 2023-06-30 NOTE — Telephone Encounter (Signed)
   Pre-operative Risk Assessment    Patient Name: Olivia Blackburn  DOB: 10/26/1945 MRN: [id number]   Date of last office visit: 06/17/23 DR. KRASOWSKI Date of next office visit: NONE   Request for Surgical Clearance    Procedure:  RIGHT FOOT 2ND. 3RD AND 4TH MT WEIL OSTEOTOMIES (MIS); POSSIBLE FLEXOR TENOTOMIES; POSSIBLE DORSAL CAPSULOTOMIES, 2ND, 3RD, AND 4TH HAMMER TOE CORRECTIONS   Date of Surgery:  Clearance TBD-AUGUST                                Surgeon:  DR. Autry Legions HEWITT Surgeon's Group or Practice Name:  [facility] Phone number:  [phone] MEGAN DAVIS Fax number:  [phone]   Type of Clearance Requested:   - Medical ; NONE INDICATED ON FORM TO BE HELD   Type of Anesthesia:  General    Additional requests/questions:    [name]   06/30/2023, 5:38 PM

## 2023-07-01 NOTE — Telephone Encounter (Signed)
   Patient Name: Olivia Blackburn  DOB: 01-28-1945 MRN: [id number]  Primary Cardiologist: Zoe Hinds, MD  Chart reviewed as part of pre-operative protocol coverage. Given past medical history and time since last visit, based on ACC/AHA guidelines, [name] is at acceptable risk for the planned procedure without further cardiovascular testing.    Per Dr. Gordan Latina on 06/17/2023: "Cardiovascular preop evaluation.  Surgery overall is low risk, not sure exactly what anesthesia type that she will have however, overall from cardiac standpoint review she is doing very well, she easily can do 4 METS, therefore, should be no problem proceeding with surgery with no reservations."  I will route this recommendation to the requesting party via Epic fax function and remove from pre-op pool.  Please call with questions.  Ava Boatman, NP 07/01/2023, 12:30 PM

## 2023-07-09 ENCOUNTER — Telehealth: Payer: Self-pay

## 2023-07-09 NOTE — Telephone Encounter (Signed)
 Left message on My Chart with CT score results per Dr. Vanetta Shawl note. Routed to PCP.

## 2023-07-16 ENCOUNTER — Ambulatory Visit: Attending: Cardiology

## 2023-07-16 DIAGNOSIS — Z01810 Encounter for preprocedural cardiovascular examination: Secondary | ICD-10-CM | POA: Diagnosis not present

## 2023-07-16 DIAGNOSIS — Z01818 Encounter for other preprocedural examination: Secondary | ICD-10-CM

## 2023-07-17 LAB — ECHOCARDIOGRAM COMPLETE
Area-P 1/2: 3.65 cm2
MV M vel: 5.09 m/s
MV Peak grad: 103.6 mmHg
Radius: 0.4 cm
S' Lateral: 3.2 cm

## 2023-07-22 ENCOUNTER — Telehealth: Payer: Self-pay

## 2023-07-22 NOTE — Telephone Encounter (Signed)
 Left message on My Chart with CA Score results per Dr. Vanetta Shawl note. Routed to PCP.

## 2023-07-23 ENCOUNTER — Telehealth: Payer: Self-pay

## 2023-07-23 NOTE — Telephone Encounter (Signed)
 Left message on My Chart with Echo results per Dr. Vanetta Shawl note. Routed to PCP.

## 2023-07-23 NOTE — Telephone Encounter (Signed)
 Pt viewed CA Score results on My Chart per Dr. Vanetta Shawl note. Routed to PCP.

## 2023-08-13 ENCOUNTER — Telehealth: Payer: Self-pay

## 2023-08-13 NOTE — Telephone Encounter (Signed)
LVM per DPR- per Dr. Vanetta Shawl note regarding Echo results. Encouraged to call with any questions. Routed to PCP.

## 2023-08-19 ENCOUNTER — Other Ambulatory Visit: Payer: Self-pay | Admitting: Cardiology

## 2023-08-21 ENCOUNTER — Telehealth: Payer: Self-pay | Admitting: Cardiology

## 2023-08-21 MED ORDER — FLECAINIDE ACETATE 50 MG PO TABS: 50.0000 mg | ORAL_TABLET | Freq: Two times a day (BID) | ORAL | 2 refills | Status: AC

## 2023-08-21 NOTE — Telephone Encounter (Signed)
*  STAT* If patient is at the pharmacy, call can be transferred to refill team.   1. Which medications need to be refilled? (please list name of each medication and dose if known)   flecainide  (TAMBOCOR ) 50 MG tablet     4. Which pharmacy/location (including street and city if local pharmacy) is medication to be sent to?  [facility] - [place], [place] - [address]     5. Do they need a 30 day or 90 day supply? 90  Per pharmacist pt is completely out

## 2023-08-21 NOTE — Telephone Encounter (Signed)
 Refill of Flecainide  50 mg sent to [facility].
